# Patient Record
Sex: Female | Born: 1955 | Race: Black or African American | Hispanic: No | Marital: Single | Smoking: Former smoker
Health system: Southern US, Community
[De-identification: ages and names within clinical notes are randomized; demographics above are authoritative.]

## PROBLEM LIST (undated history)

## (undated) DIAGNOSIS — E6609 Other obesity due to excess calories: Secondary | ICD-10-CM

## (undated) DIAGNOSIS — I10 Essential (primary) hypertension: Secondary | ICD-10-CM

## (undated) DIAGNOSIS — R931 Abnormal findings on diagnostic imaging of heart and coronary circulation: Secondary | ICD-10-CM

## (undated) DIAGNOSIS — I700 Atherosclerosis of aorta: Secondary | ICD-10-CM

## (undated) HISTORY — DX: Essential (primary) hypertension: I10

## (undated) HISTORY — DX: Atherosclerosis of aorta: I70.0

## (undated) HISTORY — DX: Abnormal findings on diagnostic imaging of heart and coronary circulation: R93.1

## (undated) HISTORY — DX: Other obesity due to excess calories: E66.09

---

## 1998-06-15 ENCOUNTER — Ambulatory Visit (HOSPITAL_COMMUNITY): Admission: RE | Admit: 1998-06-15 | Discharge: 1998-06-15 | Payer: Self-pay | Admitting: Obstetrics and Gynecology

## 2002-01-12 ENCOUNTER — Other Ambulatory Visit: Admission: RE | Admit: 2002-01-12 | Discharge: 2002-01-12 | Payer: Self-pay | Admitting: Obstetrics and Gynecology

## 2003-01-31 ENCOUNTER — Other Ambulatory Visit: Admission: RE | Admit: 2003-01-31 | Discharge: 2003-01-31 | Payer: Self-pay | Admitting: Obstetrics and Gynecology

## 2004-02-20 ENCOUNTER — Other Ambulatory Visit: Admission: RE | Admit: 2004-02-20 | Discharge: 2004-02-20 | Payer: Self-pay | Admitting: Obstetrics and Gynecology

## 2005-06-25 ENCOUNTER — Other Ambulatory Visit: Admission: RE | Admit: 2005-06-25 | Discharge: 2005-06-25 | Payer: Self-pay | Admitting: Obstetrics and Gynecology

## 2005-10-02 ENCOUNTER — Encounter: Payer: Self-pay | Admitting: Obstetrics and Gynecology

## 2008-09-19 ENCOUNTER — Emergency Department (HOSPITAL_COMMUNITY): Admission: EM | Admit: 2008-09-19 | Discharge: 2008-09-19 | Payer: Self-pay | Admitting: Emergency Medicine

## 2009-04-10 ENCOUNTER — Encounter: Payer: Self-pay | Admitting: Gastroenterology

## 2009-04-19 ENCOUNTER — Encounter (INDEPENDENT_AMBULATORY_CARE_PROVIDER_SITE_OTHER): Payer: Self-pay | Admitting: *Deleted

## 2009-04-20 ENCOUNTER — Ambulatory Visit: Payer: Self-pay | Admitting: Gastroenterology

## 2009-04-21 ENCOUNTER — Encounter (INDEPENDENT_AMBULATORY_CARE_PROVIDER_SITE_OTHER): Payer: Self-pay | Admitting: *Deleted

## 2010-07-03 NOTE — Letter (Signed)
Summary: [facility]  [facility]   Imported By: Sherian Rein 04/26/2009 10:10:19  _____________________________________________________________________  External Attachment:    Type:   Image     Comment:   External Document

## 2012-05-21 ENCOUNTER — Other Ambulatory Visit: Payer: Self-pay | Admitting: Internal Medicine

## 2012-05-21 DIAGNOSIS — E049 Nontoxic goiter, unspecified: Secondary | ICD-10-CM

## 2012-05-22 ENCOUNTER — Other Ambulatory Visit: Payer: Self-pay

## 2012-05-22 ENCOUNTER — Ambulatory Visit
Admission: RE | Admit: 2012-05-22 | Discharge: 2012-05-22 | Disposition: A | Discharge status: Home / Self Care | Payer: BC Managed Care – PPO | Source: Ambulatory Visit | Attending: Internal Medicine | Admitting: Internal Medicine

## 2012-05-22 DIAGNOSIS — E049 Nontoxic goiter, unspecified: Secondary | ICD-10-CM

## 2012-07-02 ENCOUNTER — Other Ambulatory Visit: Payer: Self-pay | Admitting: Endocrinology

## 2012-07-02 DIAGNOSIS — E041 Nontoxic single thyroid nodule: Secondary | ICD-10-CM

## 2012-12-07 ENCOUNTER — Ambulatory Visit
Admission: RE | Admit: 2012-12-07 | Discharge: 2012-12-07 | Disposition: A | Discharge status: Home / Self Care | Payer: BC Managed Care – PPO | Source: Ambulatory Visit | Attending: Endocrinology | Admitting: Endocrinology

## 2012-12-07 ENCOUNTER — Other Ambulatory Visit: Payer: BC Managed Care – PPO

## 2012-12-07 DIAGNOSIS — E041 Nontoxic single thyroid nodule: Secondary | ICD-10-CM

## 2013-03-01 ENCOUNTER — Other Ambulatory Visit: Payer: Self-pay | Admitting: Endocrinology

## 2013-03-01 DIAGNOSIS — E041 Nontoxic single thyroid nodule: Secondary | ICD-10-CM

## 2013-12-31 ENCOUNTER — Ambulatory Visit
Admission: RE | Admit: 2013-12-31 | Discharge: 2013-12-31 | Disposition: A | Discharge status: Home / Self Care | Payer: BC Managed Care – PPO | Source: Ambulatory Visit | Attending: Endocrinology | Admitting: Endocrinology

## 2013-12-31 DIAGNOSIS — E041 Nontoxic single thyroid nodule: Secondary | ICD-10-CM

## 2016-11-29 ENCOUNTER — Other Ambulatory Visit: Payer: Self-pay | Admitting: Obstetrics and Gynecology

## 2016-11-29 DIAGNOSIS — R928 Other abnormal and inconclusive findings on diagnostic imaging of breast: Secondary | ICD-10-CM

## 2016-12-03 ENCOUNTER — Ambulatory Visit: Admission: RE | Admit: 2016-12-03 | Payer: Self-pay | Source: Ambulatory Visit

## 2016-12-03 ENCOUNTER — Ambulatory Visit
Admission: RE | Admit: 2016-12-03 | Discharge: 2016-12-03 | Disposition: A | Discharge status: Home / Self Care | Payer: BLUE CROSS/BLUE SHIELD | Source: Ambulatory Visit | Attending: Obstetrics and Gynecology | Admitting: Obstetrics and Gynecology

## 2016-12-03 DIAGNOSIS — R928 Other abnormal and inconclusive findings on diagnostic imaging of breast: Secondary | ICD-10-CM

## 2019-04-21 ENCOUNTER — Other Ambulatory Visit: Payer: Self-pay | Admitting: Obstetrics and Gynecology

## 2019-04-21 DIAGNOSIS — Z803 Family history of malignant neoplasm of breast: Secondary | ICD-10-CM

## 2019-06-03 ENCOUNTER — Other Ambulatory Visit: Payer: BLUE CROSS/BLUE SHIELD

## 2021-02-28 ENCOUNTER — Other Ambulatory Visit: Payer: Self-pay | Admitting: Internal Medicine

## 2021-02-28 DIAGNOSIS — I10 Essential (primary) hypertension: Secondary | ICD-10-CM

## 2021-03-22 ENCOUNTER — Ambulatory Visit
Admission: RE | Admit: 2021-03-22 | Discharge: 2021-03-22 | Disposition: A | Discharge status: Home / Self Care | Payer: Self-pay | Source: Ambulatory Visit | Attending: Internal Medicine | Admitting: Internal Medicine

## 2021-03-22 DIAGNOSIS — I10 Essential (primary) hypertension: Secondary | ICD-10-CM

## 2021-05-24 ENCOUNTER — Other Ambulatory Visit: Payer: Self-pay

## 2021-05-24 ENCOUNTER — Ambulatory Visit: Payer: Managed Care, Other (non HMO) | Admitting: Cardiology

## 2021-05-24 ENCOUNTER — Encounter: Payer: Self-pay | Admitting: Cardiology

## 2021-05-24 VITALS — BP 132/78 | HR 65 | Temp 98.1°F | Resp 16 | Ht 68.5 in | Wt 227.2 lb

## 2021-05-24 DIAGNOSIS — R931 Abnormal findings on diagnostic imaging of heart and coronary circulation: Secondary | ICD-10-CM | POA: Insufficient documentation

## 2021-05-24 DIAGNOSIS — I700 Atherosclerosis of aorta: Secondary | ICD-10-CM

## 2021-05-24 DIAGNOSIS — I10 Essential (primary) hypertension: Secondary | ICD-10-CM

## 2021-05-24 DIAGNOSIS — E6609 Other obesity due to excess calories: Secondary | ICD-10-CM | POA: Insufficient documentation

## 2021-05-24 MED ORDER — ASPIRIN EC 81 MG PO TBEC: 81.0000 mg | DELAYED_RELEASE_TABLET | Freq: Every day | ORAL | 11 refills | Status: AC

## 2021-05-24 MED ORDER — ATORVASTATIN CALCIUM 20 MG PO TABS: 20.0000 mg | ORAL_TABLET | Freq: Every day | ORAL | 0 refills | Status: DC

## 2021-05-24 NOTE — Progress Notes (Signed)
Date:  05/24/2021   ID:  FREDERICK KLINGER, DOB 04/25/1956, MRN [id number]  PCP:  Deland Pretty, MD  Cardiologist:  Rex Kras, DO, [facility] (established care 05/24/2021)  REASON FOR CONSULT: Coronary artery disease  REQUESTING PHYSICIAN:  Deland Pretty, MD [address] [place],  [place] [address]  Chief Complaint  Patient presents with   Coronary Artery Disease   New Patient (Initial Visit)    HPI  Alice Castaneda is a 65 y.o. African-American female who presents to the office with a chief complaint of " CAD evaluation due to elevated coronary calcium score." Patient's past medical history and cardiovascular risk factors include: Mild coronary artery calcification (total CAC 62.7, 86 percentile), aortic atherosclerosis, hx of fen-phen utilization, former smoker, pulmonary nodule, family history of heart disease.  She is referred to the office at the request of Deland Pretty, MD for evaluation of CAD evaluation (coronary artery calcification).  Patient recently had a comprehensive screening performed by PCP according to the patient and one of the items that was looked into his coronary artery calcification.    Patient had a total coronary calcium score of 62.7 AU placing her at the 86 percentile.  She is now referred to cardiology for further evaluation and management.  She denies any anginal chest pain however does have shortness of breath with exertion.  Patient states that she can walk long distances but unable to pick up pace without becoming symptomatic.  This has been chronic and stable.  She denies any heart failure symptoms.  No structured exercise program or daily routine.  No family history of premature CAD, sudden cardiac death, or cardiomyopathy.  FUNCTIONAL STATUS: No structured exercise program or daily routine.   ALLERGIES: No Known Allergies  MEDICATION LIST PRIOR TO VISIT: Current Meds  Medication Sig   aspirin EC 81 MG tablet Take 1 tablet (81 mg  total) by mouth daily. Swallow whole.   atorvastatin (LIPITOR) 20 MG tablet Take 1 tablet (20 mg total) by mouth at bedtime.   losartan-hydrochlorothiazide (HYZAAR) 100-25 MG tablet Take 1 tablet by mouth daily.     PAST MEDICAL HISTORY: Past Medical History:  Diagnosis Date   Agatston coronary artery calcium score less than 100    Aortic atherosclerosis (HCC)    Hypertension    Obesity due to excess calories     PAST SURGICAL HISTORY: History reviewed. No pertinent surgical history.  FAMILY HISTORY: The patient family history includes Breast cancer in her maternal grandmother and mother; Diabetes in her father and sister; Heart failure in her mother; Stroke in her father.  SOCIAL HISTORY:  The patient  reports that she has quit smoking. Her smoking use included cigarettes. She has a 5.00 pack-year smoking history. She has never used smokeless tobacco. She reports current alcohol use of about 3.0 standard drinks per week. She reports that she does not use drugs.  REVIEW OF SYSTEMS: Review of Systems  Constitutional: Negative for chills and fever.  HENT:  Negative for hoarse voice and nosebleeds.   Eyes:  Negative for discharge, double vision and pain.  Cardiovascular:  Positive for dyspnea on exertion (chronic and stable). Negative for chest pain, claudication, leg swelling, near-syncope, orthopnea, palpitations, paroxysmal nocturnal dyspnea and syncope.  Respiratory:  Negative for hemoptysis and shortness of breath.   Musculoskeletal:  Negative for muscle cramps and myalgias.  Gastrointestinal:  Negative for abdominal pain, constipation, diarrhea, hematemesis, hematochezia, melena, nausea and vomiting.  Neurological:  Negative for dizziness and light-headedness.  PHYSICAL EXAM: Vitals with BMI 05/24/2021  Height 5' 8.5"  Weight 227 lbs 3 oz  BMI 98.26  Systolic 415  Diastolic 78  Pulse 65    CONSTITUTIONAL: Well-developed and well-nourished. No acute distress.  SKIN:  Skin is warm and dry. No rash noted. No cyanosis. No pallor. No jaundice HEAD: Normocephalic and atraumatic.  EYES: No scleral icterus MOUTH/THROAT: Moist oral membranes.  NECK: No JVD present. No thyromegaly noted. No carotid bruits  LYMPHATIC: No visible cervical adenopathy.  CHEST Normal respiratory effort. No intercostal retractions  LUNGS: Clear to auscultation bilaterally.  No stridor. No wheezes. No rales.  CARDIOVASCULAR: Regular rate and rhythm, positive S1-S2, no murmurs rubs or gallops appreciated. ABDOMINAL: No apparent ascites.  EXTREMITIES: No peripheral edema, warm to touch, 2+ bilateral DP and PT pulses HEMATOLOGIC: No significant bruising NEUROLOGIC: Oriented to person, place, and time. Nonfocal. Normal muscle tone.  PSYCHIATRIC: Normal mood and affect. Normal behavior. Cooperative  CARDIAC DATABASE: EKG: 05/24/2021: Sinus bradycardia, 57 bpm, without underlying ischemia or injury pattern.  Echocardiogram: No results found for this or any previous visit from the past 1095 days.    Stress Testing: No results found for this or any previous visit from the past 1095 days.   Heart Catheterization: None  Coronary artery calcium scoring: 03/22/2021: Left Main: 0   LAD: 62.7   LCx: 0   RCA: 0   Total Agatston Score: 62.7   MESA database percentile: 86   AORTA MEASUREMENTS: Ascending Aorta: 29 mm Descending Aorta: 24 mm 1. Coronary calcium score is 62.7 and this is at percentile 86 for patients of the same age, gender and ethnicity. 2. Small indeterminate pulmonary nodules. No follow-up needed if patient is low-risk (and has no known or suspected primary neoplasm). Non-contrast chest CT can be considered in 12 months if patient is high-risk. This recommendation follows the consensus statement: Guidelines for Management of Incidental Pulmonary Nodules Detected on CT Images: From the Fleischner Society 2017; Radiology 2017; 284:228-243. 3.  Aortic  Atherosclerosis (ICD10-I70.0).  LABORATORY DATA: External Labs: Collected:  Total cholesterol 151, HDL 58, LDL 76, triglycerides 85 Hemoglobin 13.7 g/dL, hematocrit 43.4% Sodium 144, potassium 4.3, chloride 108, bicarb 28, BUN 19, creatinine 1.17 mg/dL. eGFR 57 TSH 1.74   IMPRESSION:    ICD-10-CM   1. Agatston coronary artery calcium score less than 100  R93.1 EKG 12-Lead    PCV ECHOCARDIOGRAM COMPLETE    PCV MYOCARDIAL PERFUSION WO LEXISCAN    aspirin EC 81 MG tablet    atorvastatin (LIPITOR) 20 MG tablet    Lipid Panel With LDL/HDL Ratio    LDL cholesterol, direct    CMP14+EGFR    2. Aortic atherosclerosis (HCC)  I70.0 PCV MYOCARDIAL PERFUSION WO LEXISCAN    aspirin EC 81 MG tablet    atorvastatin (LIPITOR) 20 MG tablet    3. Benign hypertension  I10 PCV ECHOCARDIOGRAM COMPLETE    4. Class 1 obesity due to excess calories with serious comorbidity and body mass index (BMI) of 34.0 to 34.9 in adult  E66.09    Z68.34        RECOMMENDATIONS: Alice Castaneda is a 65 y.o. African-American female whose past medical history and cardiac risk factors include: Mild coronary artery calcification (total CAC 62.7, 86 percentile), aortic atherosclerosis, hx of fen-phen utilization, former smoker, pulmonary nodule, family history of heart disease.  Agatston coronary artery calcium score less than 100 Total CAC 62.7AU, 86 percentile Start aspirin 81 mg p.o. daily. Start atorvastatin 20  mg p.o. nightly. Echocardiogram will be ordered to evaluate for structural heart disease and left ventricular systolic function. Nuclear stress test recommended to evaluate for reversible ischemia. Most recent lipid profile reviewed and noted above for further reference. Recheck fasting lipid profile and LFTs in 6 weeks after starting statin therapy.  Aortic atherosclerosis ([place]) See above  Benign hypertension Office blood pressures within acceptable range.  Medications reviewed.  Low salt diet  recommended.   Class 1 obesity due to excess calories with serious comorbidity and body mass index (BMI) of 34.0 to 34.9 in adult Body mass index is 34.04 kg/m. I reviewed with the patient the importance of diet, regular physical activity/exercise, weight loss.   Patient is educated on increasing physical activity gradually as tolerated.  With the goal of moderate intensity exercise for 30 minutes a day 5 days a week.  FINAL MEDICATION LIST END OF ENCOUNTER: Meds ordered this encounter  Medications   aspirin EC 81 MG tablet    Sig: Take 1 tablet (81 mg total) by mouth daily. Swallow whole.    Dispense:  30 tablet    Refill:  11   atorvastatin (LIPITOR) 20 MG tablet    Sig: Take 1 tablet (20 mg total) by mouth at bedtime.    Dispense:  90 tablet    Refill:  0    There are no discontinued medications.   Current Outpatient Medications:    aspirin EC 81 MG tablet, Take 1 tablet (81 mg total) by mouth daily. Swallow whole., Disp: 30 tablet, Rfl: 11   atorvastatin (LIPITOR) 20 MG tablet, Take 1 tablet (20 mg total) by mouth at bedtime., Disp: 90 tablet, Rfl: 0   losartan-hydrochlorothiazide (HYZAAR) 100-25 MG tablet, Take 1 tablet by mouth daily., Disp: , Rfl:   Orders Placed This Encounter  Procedures   Lipid Panel With LDL/HDL Ratio   LDL cholesterol, direct   CMP14+EGFR   PCV MYOCARDIAL PERFUSION WO LEXISCAN   EKG 12-Lead   PCV ECHOCARDIOGRAM COMPLETE    There are no Patient Instructions on file for this visit.   --Continue cardiac medications as reconciled in final medication list. --Return in about 7 weeks (around 07/12/2021). Or sooner if needed. --Continue follow-up with your primary care physician regarding the management of your other chronic comorbid conditions.  Patient's questions and concerns were addressed to her satisfaction. She voices understanding of the instructions provided during this encounter.   This note was created using a voice recognition software as a  result there may be grammatical errors inadvertently enclosed that do not reflect the nature of this encounter. Every attempt is made to correct such errors.  Rex Kras, [place], [facility]  Pager: [phone] Office: [phone]

## 2021-06-04 ENCOUNTER — Encounter: Payer: Self-pay | Admitting: Cardiology

## 2021-06-07 ENCOUNTER — Other Ambulatory Visit: Payer: Self-pay

## 2021-06-07 DIAGNOSIS — R931 Abnormal findings on diagnostic imaging of heart and coronary circulation: Secondary | ICD-10-CM

## 2021-06-18 ENCOUNTER — Other Ambulatory Visit: Payer: Self-pay

## 2021-06-18 ENCOUNTER — Encounter: Payer: Self-pay | Admitting: Plastic Surgery

## 2021-06-18 ENCOUNTER — Ambulatory Visit: Payer: Managed Care, Other (non HMO)

## 2021-06-18 ENCOUNTER — Ambulatory Visit (INDEPENDENT_AMBULATORY_CARE_PROVIDER_SITE_OTHER): Payer: Self-pay | Admitting: Plastic Surgery

## 2021-06-18 DIAGNOSIS — Z719 Counseling, unspecified: Secondary | ICD-10-CM

## 2021-06-18 DIAGNOSIS — I700 Atherosclerosis of aorta: Secondary | ICD-10-CM

## 2021-06-18 DIAGNOSIS — R931 Abnormal findings on diagnostic imaging of heart and coronary circulation: Secondary | ICD-10-CM

## 2021-06-18 NOTE — Progress Notes (Addendum)
Patient ID: Alice Castaneda, female    DOB: [birth year], 66 y.o.   MRN: [id number]   Chief Complaint  Patient presents with   Advice Only    The patient is a 66 year old female here for evaluation of her face.  Feels that she has a very tired look.  She does not like the excess fat on the lower implants and the excess skin on the upper lids.  She is 5 feet 8 inches tall and weighs 220 pounds.  She has excess skin of the upper lids with a little bit of medial fat excess on each lead.  The lower lid looks like it is mostly excess fat or better yet bulging fat with loss in the tear trough area.  She does not have any eye problems and she is otherwise in good health.  She does not have a history of any surgeries but does not think she keloids as she had a burn to her foot a couple years ago and healed very nicely.  No symptoms of dry eyes or signs of scleral irritation.   Review of Systems  Constitutional: Negative.   HENT: Negative.    Eyes: Negative.   Respiratory: Negative.    Cardiovascular: Negative.   Gastrointestinal: Negative.   Endocrine: Negative.   Genitourinary: Negative.   Musculoskeletal: Negative.   Skin: Negative.   Psychiatric/Behavioral: Negative.     Past Medical History:  Diagnosis Date   Agatston coronary artery calcium score less than 100    Aortic atherosclerosis (HCC)    Hypertension    Obesity due to excess calories     History reviewed. No pertinent surgical history.    Current Outpatient Medications:    aspirin EC 81 MG tablet, Take 1 tablet (81 mg total) by mouth daily. Swallow whole., Disp: 30 tablet, Rfl: 11   atorvastatin (LIPITOR) 20 MG tablet, Take 1 tablet (20 mg total) by mouth at bedtime., Disp: 90 tablet, Rfl: 0   losartan-hydrochlorothiazide (HYZAAR) 100-25 MG tablet, Take 1 tablet by mouth daily., Disp: , Rfl:    Objective:   There were no vitals filed for this visit.  Physical Exam Constitutional:      Appearance: Normal appearance.   HENT:     Head: Normocephalic and atraumatic.  Cardiovascular:     Rate and Rhythm: Normal rate.     Pulses: Normal pulses.  Pulmonary:     Effort: Pulmonary effort is normal. No respiratory distress.  Abdominal:     General: There is no distension.     Palpations: Abdomen is soft.     Tenderness: There is no abdominal tenderness.  Musculoskeletal:        General: No swelling.  Skin:    General: Skin is warm.     Capillary Refill: Capillary refill takes less than 2 seconds.  Neurological:     Mental Status: She is alert and oriented to person, place, and time.  Psychiatric:        Mood and Affect: Mood normal.        Behavior: Behavior normal.        Thought Content: Thought content normal.    Assessment & Plan:  Encounter for counseling  The patient is a good candidate for bilateral upper and lower lid blepharoplasty with a conjunctival approach for the lower lid.  We will send her a quote.  We discussed time off of work and scars.  Pictures were obtained of the patient and placed in the  chart with the patient's or guardian's permission.   [place], DO

## 2021-06-19 ENCOUNTER — Ambulatory Visit: Payer: Managed Care, Other (non HMO)

## 2021-06-19 DIAGNOSIS — I10 Essential (primary) hypertension: Secondary | ICD-10-CM

## 2021-06-19 DIAGNOSIS — R931 Abnormal findings on diagnostic imaging of heart and coronary circulation: Secondary | ICD-10-CM

## 2021-06-26 ENCOUNTER — Encounter: Payer: Self-pay | Admitting: Plastic Surgery

## 2021-06-30 LAB — LIPID PANEL WITH LDL/HDL RATIO
Cholesterol, Total: 134 mg/dL (ref 100–199)
HDL: 62 mg/dL (ref >39)
LDL Chol Calc (NIH): 59 mg/dL (ref 0–99)
LDL/HDL Ratio: 1 ratio (ref 0.0–3.2)
Triglycerides: 63 mg/dL (ref 0–149)
VLDL Cholesterol Cal: 13 mg/dL (ref 5–40)

## 2021-06-30 LAB — CMP14+EGFR
ALT: 20 IU/L (ref 0–32)
AST: 16 IU/L (ref 0–40)
Albumin/Globulin Ratio: 1.3 (ref 1.2–2.2)
Albumin: 4.2 g/dL (ref 3.8–4.8)
Alkaline Phosphatase: 65 IU/L (ref 44–121)
BUN/Creatinine Ratio: 15 (ref 12–28)
BUN: 14 mg/dL (ref 8–27)
Bilirubin Total: 0.9 mg/dL (ref 0.0–1.2)
CO2: 26 mmol/L (ref 20–29)
Calcium: 9.4 mg/dL (ref 8.7–10.3)
Chloride: 104 mmol/L (ref 96–106)
Creatinine, Ser: 0.92 mg/dL (ref 0.57–1.00)
Globulin, Total: 3.3 g/dL (ref 1.5–4.5)
Glucose: 90 mg/dL (ref 70–99)
Potassium: 3.9 mmol/L (ref 3.5–5.2)
Sodium: 142 mmol/L (ref 134–144)
Total Protein: 7.5 g/dL (ref 6.0–8.5)
eGFR: 70 mL/min/{1.73_m2} (ref >59)

## 2021-07-04 LAB — LDL CHOLESTEROL, DIRECT: LDL Direct: 59 mg/dL (ref 0–99)

## 2021-07-06 ENCOUNTER — Encounter: Payer: Self-pay | Admitting: Plastic Surgery

## 2021-07-09 ENCOUNTER — Telehealth: Payer: Self-pay

## 2021-07-09 NOTE — Telephone Encounter (Signed)
I called patient to schedule a televisit with Dr. Ulice Bold and she said she no longer thinks she needs this appointment.  Patient said she wasn't aware of the pre-op appointment before her surgery.  Patient said she thought she wasn't going to be able to talk with her provider again before the surgery and she had questions.  She said that she is fine to wait until her scheduled pre-op appointment to go over her questions with Keenan Bachelor, PA-C.

## 2021-07-10 ENCOUNTER — Telehealth: Payer: Self-pay

## 2021-07-10 NOTE — Telephone Encounter (Signed)
Faxed surgical clearance to Dr Nash Dimmer, DO [facility]

## 2021-07-11 ENCOUNTER — Ambulatory Visit: Payer: Managed Care, Other (non HMO) | Admitting: Cardiology

## 2021-07-13 ENCOUNTER — Encounter: Payer: Self-pay | Admitting: Surgical

## 2021-07-13 NOTE — Progress Notes (Signed)
Surgical Clearance has been received from Dr. Tessa Lerner, DO for patient's upcoming surgery bilateral upper and lower lid blepharoplasty with Dr. Ulice Bold.  Patient can hold aspirin 81 mg prior to surgery, recommend holding 7 days prior to surgery.  Restart when appropriate hemostasis is achieved.  Recommend patient take blood pressure medications day of surgery.  Patient is acceptable/low risk for upcoming surgery.

## 2021-07-23 ENCOUNTER — Other Ambulatory Visit: Payer: Self-pay

## 2021-07-23 ENCOUNTER — Encounter: Payer: Self-pay | Admitting: Cardiology

## 2021-07-23 ENCOUNTER — Ambulatory Visit: Payer: Managed Care, Other (non HMO) | Admitting: Cardiology

## 2021-07-23 VITALS — BP 126/76 | HR 63 | Resp 16 | Ht 68.0 in | Wt 230.0 lb

## 2021-07-23 DIAGNOSIS — I10 Essential (primary) hypertension: Secondary | ICD-10-CM

## 2021-07-23 DIAGNOSIS — R931 Abnormal findings on diagnostic imaging of heart and coronary circulation: Secondary | ICD-10-CM

## 2021-07-23 DIAGNOSIS — E66811 Obesity, class 1: Secondary | ICD-10-CM

## 2021-07-23 DIAGNOSIS — E6609 Other obesity due to excess calories: Secondary | ICD-10-CM

## 2021-07-23 DIAGNOSIS — Z6834 Body mass index (BMI) 34.0-34.9, adult: Secondary | ICD-10-CM

## 2021-07-23 DIAGNOSIS — I700 Atherosclerosis of aorta: Secondary | ICD-10-CM

## 2021-07-23 NOTE — Progress Notes (Signed)
Date:  05/24/2021   ID:  Alice Castaneda, DOB [birth year], MRN [id number]  PCP:  Alice Pretty, MD  Cardiologist:  Rex Kras, DO, [facility] (established care 05/24/2021)  Date: 07/23/21 Last Office Visit: 05/24/2021  Chief Complaint  Patient presents with   Results   Follow-up    HPI  Alice Castaneda is a 66 y.o. African-American female who presents to the office with a chief complaint of " follow-up for coronary artery calcification and discuss test results." Patient's past medical history and cardiovascular risk factors include: Mild coronary artery calcification (total CAC 62.7, 86 percentile), aortic atherosclerosis, hx of fen-phen utilization, former smoker, pulmonary nodule, family history of heart disease.  She is referred to the office at the request of Alice Pretty, MD for evaluation of CAD evaluation (coronary artery calcification).  Prior to establishing care patient underwent a comprehensive screening with her PCP and was noted to have mild coronary artery calcification.  Total CAC was 62.7 AU placing her at the 86 percentile.  She was referred to cardiology for further evaluation and management.  She did not have any anginal discomfort but would have shortness of breath with over exertional activity.  Patient states that she walks regularly when she tries to pick up the pace she becomes symptomatic.  Since last office visit she underwent an echocardiogram which notes preserved LVEF and stress test is overall low risk.  Results of both diagnostic testing were reviewed with her in great detail and noted below for further reference.  She was started on aspirin and cholesterol medication given her mild CAC and increased percentile compared to her cohorts.  She is tolerating aspirin 81 mg p.o. daily without any intolerances.  And repeat lipid profile independently reviewed notes significant improvement in LDL levels, and stable AST/ALT.  No new anginal discomfort or heart failure  symptoms.  FUNCTIONAL STATUS: No structured exercise program or daily routine.   ALLERGIES: No Known Allergies  MEDICATION LIST PRIOR TO VISIT: Current Meds  Medication Sig   aspirin EC 81 MG tablet Take 1 tablet (81 mg total) by mouth daily. Swallow whole.   atorvastatin (LIPITOR) 20 MG tablet Take 1 tablet (20 mg total) by mouth at bedtime.   losartan-hydrochlorothiazide (HYZAAR) 100-25 MG tablet Take 1 tablet by mouth daily.     PAST MEDICAL HISTORY: Past Medical History:  Diagnosis Date   Agatston coronary artery calcium score less than 100    Aortic atherosclerosis (HCC)    Hypertension    Obesity due to excess calories     PAST SURGICAL HISTORY: History reviewed. No pertinent surgical history.  FAMILY HISTORY: The patient family history includes Breast cancer in her maternal grandmother and mother; Diabetes in her father and sister; Heart failure in her mother; Stroke in her father.  SOCIAL HISTORY:  The patient  reports that she has quit smoking. Her smoking use included cigarettes. She has a 5.00 pack-year smoking history. She has never used smokeless tobacco. She reports current alcohol use of about 3.0 standard drinks per week. She reports that she does not use drugs.  REVIEW OF SYSTEMS: Review of Systems  Constitutional: Negative for chills and fever.  HENT:  Negative for hoarse voice and nosebleeds.   Eyes:  Negative for discharge, double vision and pain.  Cardiovascular:  Positive for dyspnea on exertion (chronic and stable). Negative for chest pain, claudication, leg swelling, near-syncope, orthopnea, palpitations, paroxysmal nocturnal dyspnea and syncope.  Respiratory:  Negative for hemoptysis and shortness of breath.  Musculoskeletal:  Negative for muscle cramps and myalgias.  Gastrointestinal:  Negative for abdominal pain, constipation, diarrhea, hematemesis, hematochezia, melena, nausea and vomiting.  Neurological:  Negative for dizziness and  light-headedness.   PHYSICAL EXAM: Vitals with BMI 07/23/2021 06/18/2021 05/24/2021  Height _0  _1  5' 8.5"  Weight 230 lbs 220 lbs 227 lbs 3 oz  BMI 34.98 82.95 62.13  Systolic 086 578 469  Diastolic 76 76 78  Pulse 63 - 65    CONSTITUTIONAL: Well-developed and well-nourished. No acute distress.  SKIN: Skin is warm and dry. No rash noted. No cyanosis. No pallor. No jaundice HEAD: Normocephalic and atraumatic.  EYES: No scleral icterus MOUTH/THROAT: Moist oral membranes.  NECK: No JVD present. No thyromegaly noted. No carotid bruits  LYMPHATIC: No visible cervical adenopathy.  CHEST Normal respiratory effort. No intercostal retractions  LUNGS: Clear to auscultation bilaterally.  No stridor. No wheezes. No rales.  CARDIOVASCULAR: Regular rate and rhythm, positive S1-S2, no murmurs rubs or gallops appreciated. ABDOMINAL: No apparent ascites.  EXTREMITIES: No peripheral edema, warm to touch, 2+ bilateral DP and PT pulses HEMATOLOGIC: No significant bruising NEUROLOGIC: Oriented to person, place, and time. Nonfocal. Normal muscle tone.  PSYCHIATRIC: Normal mood and affect. Normal behavior. Cooperative  CARDIAC DATABASE: EKG: 05/24/2021: Sinus bradycardia, 57 bpm, without underlying ischemia or injury pattern.  Echocardiogram: 06/19/2021: Left ventricle cavity is normal in size and wall thickness. Normal global wall motion. Normal LV systolic function with EF 61%. Normal diastolic filling pattern.  Trace MR, trace TR. No evidence of pulmonary hypertension.  Stress Testing: Exercise Sestamibi stress test 06/18/2021: Exercise time 5 minutes 55 seconds on Bruce protocol, achieved 7.05 METS, 91% of APMHR. Stress ECG negative for ischemia. Normal myocardial perfusion without convincing evidence of reversible myocardial ischemia or prior infarct. Calculated LVEF preserved, 78%. Left ventricular wall thickness preserved without regional wall motion abnormalities. No prior studies  available for comparison. Low risk study.  Heart Catheterization: None  Coronary artery calcium scoring: 03/22/2021: Left Main: 0   LAD: 62.7   LCx: 0   RCA: 0   Total Agatston Score: 62.7   MESA database percentile: 86   AORTA MEASUREMENTS: Ascending Aorta: 29 mm Descending Aorta: 24 mm 1. Coronary calcium score is 62.7 and this is at percentile 86 for patients of the same age, gender and ethnicity. 2. Small indeterminate pulmonary nodules. No follow-up needed if patient is low-risk (and has no known or suspected primary neoplasm). Non-contrast chest CT can be considered in 12 months if patient is high-risk. This recommendation follows the consensus statement: Guidelines for Management of Incidental Pulmonary Nodules Detected on CT Images: From the Fleischner Society 2017; Radiology 2017; 284:228-243. 3.  Aortic Atherosclerosis (ICD10-I70.0).  LABORATORY DATA: External Labs: Collected:  Total cholesterol 151, HDL 58, LDL 76, triglycerides 85 Hemoglobin 13.7 g/dL, hematocrit 43.4% Sodium 144, potassium 4.3, chloride 108, bicarb 28, BUN 19, creatinine 1.17 mg/dL. eGFR 57 TSH 1.74   IMPRESSION:    ICD-10-CM   1. Agatston coronary artery calcium score less than 100  R93.1     2. Aortic atherosclerosis (HCC)  I70.0     3. Benign hypertension  I10     4. Class 1 obesity due to excess calories with serious comorbidity and body mass index (BMI) of 34.0 to 34.9 in adult  E66.09    Z68.34        RECOMMENDATIONS: ZACARI RADICK is a 66 y.o. African-American female whose past medical history and cardiac risk factors include: Mild  coronary artery calcification (total CAC 62.7, 86 percentile), aortic atherosclerosis, hx of fen-phen utilization, former smoker, pulmonary nodule, family history of heart disease.  Agatston coronary artery calcium score less than 100 /aortic atherosclerosis: Chronic and stable. Continue aspirin and statin therapy. Independently reviewed labs  06/29/2021 -noted above for further reference. Also reviewed echocardiogram and stress test results.  LVEF is preserved, and stress test is overall low risk study. No anginal discomfort or heart failure symptoms.  Benign hypertension Office blood pressures are very well controlled. Medications reconciled. No additional changes recommended  Class 1 obesity due to excess calories with serious comorbidity and body mass index (BMI) of 34.0 to 34.9 in adult Patient is motivated with regards to weight loss. Body mass index is 34.97 kg/m. I reviewed with the patient the importance of diet, regular physical activity/exercise, weight loss.   Patient is educated on increasing physical activity gradually as tolerated.  With the goal of moderate intensity exercise for 30 minutes a day 5 days a week.  During today's office encounter we discussed management of at least 2 chronic comorbid conditions, waiting on the response to recently initiated pharmacological therapy aspirin/statin therapy, independently reviewed labs, echocardiogram, stress test results.  Discussed disease management and longitudinal follow-up.  FINAL MEDICATION LIST END OF ENCOUNTER: No orders of the defined types were placed in this encounter.   There are no discontinued medications.   Current Outpatient Medications:    aspirin EC 81 MG tablet, Take 1 tablet (81 mg total) by mouth daily. Swallow whole., Disp: 30 tablet, Rfl: 11   atorvastatin (LIPITOR) 20 MG tablet, Take 1 tablet (20 mg total) by mouth at bedtime., Disp: 90 tablet, Rfl: 0   losartan-hydrochlorothiazide (HYZAAR) 100-25 MG tablet, Take 1 tablet by mouth daily., Disp: , Rfl:   No orders of the defined types were placed in this encounter.   There are no Patient Instructions on file for this visit.   --Continue cardiac medications as reconciled in final medication list. --Return in about 1 year (around 07/04/2022) for Follow up, Coronary artery calcification. Or  sooner if needed. --Continue follow-up with your primary care physician regarding the management of your other chronic comorbid conditions.  Patient's questions and concerns were addressed to her satisfaction. She voices understanding of the instructions provided during this encounter.   This note was created using a voice recognition software as a result there may be grammatical errors inadvertently enclosed that do not reflect the nature of this encounter. Every attempt is made to correct such errors.  Rex Kras, [place], [facility]  Pager: [phone] Office: [phone]

## 2021-08-06 NOTE — Progress Notes (Unsigned)
Patient ID: Alice Castaneda, female    DOB: 1955-08-21, 66 y.o.   MRN: [id number]  Chief Complaint  Patient presents with   Pre-op Exam      ICD-10-CM   1. Encounter for counseling  Z71.9       History of Present Illness: Alice Castaneda is a 66 y.o.  female .  She presents for preoperative evaluation for upcoming procedure, bilateral upper and lower lid blepharoplasty, fat filling to bilateral earlobe, scheduled for 08/30/2021 with Dr. Ulice Bold.  The patient has not had problems with anesthesia. No history of DVT/PE.  No family history of DVT/PE.  No family or personal history of bleeding or clotting disorders.  Patient is not currently taking any blood thinners.  No history of CVA/MI.   PMH Significant for: Aortic atherosclerosis, hypertension, mild coronary artery calcification She is on ASA 81 mg daily, per cardiology patient can hold 7 days prior to surgery. Patient is aware of this.  She did have an echocardiogram and stress test in January 2023.  Stress EKG negative for ischemia, low risk study.  Essentially normal echocardiogram.  Patient has a lot of good questions about the scheduled procedure, she also has some questions about botox for her forehead. She reports she had a second opinion at another office as well for her eyelid surgery. She has questions about post-surgery insurance coverage.   She reports today that she read through the consent form sent to her via MyChart and she has a list of questions about the surgery and the consent forms.   Past Medical History: Allergies: No Known Allergies  Current Medications:  Current Outpatient Medications:    aspirin EC 81 MG tablet, Take 1 tablet (81 mg total) by mouth daily. Swallow whole., Disp: 30 tablet, Rfl: 11   atorvastatin (LIPITOR) 20 MG tablet, Take 1 tablet (20 mg total) by mouth at bedtime., Disp: 90 tablet, Rfl: 0   losartan-hydrochlorothiazide (HYZAAR) 100-25 MG tablet, Take 1 tablet by mouth daily., Disp:  , Rfl:   Past Medical Problems: Past Medical History:  Diagnosis Date   Agatston coronary artery calcium score less than 100    Aortic atherosclerosis (HCC)    Hypertension    Obesity due to excess calories     Past Surgical History: No past surgical history on file.  Social History: Social History   Socioeconomic History   Marital status: Single    Spouse name: Not on file   Number of children: 0   Years of education: Not on file   Highest education level: Not on file  Occupational History   Not on file  Tobacco Use   Smoking status: Former    Packs/day: 0.25    Years: 20.00    Pack years: 5.00    Types: Cigarettes   Smokeless tobacco: Never  Vaping Use   Vaping Use: Never used  Substance and Sexual Activity   Alcohol use: Yes    Alcohol/week: 3.0 standard drinks    Types: 1 Glasses of wine, 1 Cans of beer, 1 Shots of liquor per week    Comment: 2 drinks a month   Drug use: Never   Sexual activity: Not on file  Other Topics Concern   Not on file  Social History Narrative   Not on file   Social Determinants of Health   Financial Resource Strain: Not on file  Food Insecurity: Not on file  Transportation Needs: Not on file  Physical Activity: Not on  file  Stress: Not on file  Social Connections: Not on file  Intimate Partner Violence: Not on file    Family History: Family History  Problem Relation Age of Onset   Heart failure Mother    Breast cancer Mother    Stroke Father    Diabetes Father    Diabetes Sister    Breast cancer Maternal Grandmother     Review of Systems: Review of Systems  Constitutional: Negative.   Respiratory: Negative.    Cardiovascular: Negative.   Gastrointestinal: Negative.   Neurological: Negative.    Physical Exam: Vital Signs BP (!) 146/83 (BP Location: Left Arm, Patient Position: Sitting, Cuff Size: Large)    Pulse 71    Ht 5' 8.5" (1.74 m)    Wt 225 lb (102.1 kg)    SpO2 98%    BMI 33.71 kg/m   Physical  Exam Constitutional:      General: Not in acute distress.    Appearance: Normal appearance. Not ill-appearing.  HENT:     Head: Normocephalic and atraumatic.  Eyes:     Pupils: Pupils are equal, round.  Neck:     Musculoskeletal: Normal range of motion.  Cardiovascular:     Rate and Rhythm: Normal rate    Pulses: Normal pulses.  Pulmonary:     Effort: Pulmonary effort is normal. No respiratory distress.  Musculoskeletal: Normal range of motion.  Skin:    General: Skin is warm and dry.     Findings: No erythema or rash.  Neurological:     General: No focal deficit present.     Mental Status: Alert and oriented to person, place, and time. Mental status is at baseline.     Motor: No weakness.  Psychiatric:        Mood and Affect: Mood normal.        Behavior: Behavior normal.    Assessment/Plan: The patient is scheduled for bilateral upper and lower lid blepharoplasty, fat filling of bilateral earlobes with Dr. Ulice Bold.  Risks, benefits, and alternatives of procedure discussed, questions answered and consent obtained.    Smoking Status: ***; Counseling Given? ***  Caprini Score: ***; Risk Factors include: ***, BMI *** 25, and length of planned surgery. Recommendation for mechanical *** prophylaxis. Encourage early ambulation.   Pictures obtained: @consult   Post-op Rx sent to pharmacy:  No rx sent at this time, will send post-operatively or closer to surgical date  Patient was provided with the General Surgical Risk consent document and Pain Medication Agreement prior to their appointment.  They had adequate time to read through the risk consent documents and Pain Medication Agreement. We also discussed them in person together during this preop appointment. All of their questions were answered to their satisfaction.  Recommended calling if they have any further questions.  Risk consent form and Pain Medication Agreement to be scanned into patient's chart.  Patient was sent the  blepharoplasty surgical consent form via MyChart prior to her procedure, she confirmed receipt and confirmed that she read through this prior to her procedure today.  The risks that can be encountered with and after a blepharoplasty were discussed and include the following but no limited to these:  Asymmetry, dry eyes, lid lag, sensitivity to sun or bright light, difficulty closing your eyes, outward rolling of the eyelid, change in vision, fluid accumulation, firmness of the area, fat necrosis with death of fat tissue, bleeding, infection, delayed healing, anesthesia risks, skin sensation changes, injury to structures including nerves, blood  vessels, and muscles which may be temporary or permanent, hair loss, allergies to tape, suture materials and glues, blood products, topical preparations or injected agents, skin and contour irregularities, skin discoloration and swelling, deep vein thrombosis, cardiac and pulmonary complications, pain, which may persist, persistent pain, recurrence, poor healing of the incision, possible need for revisional surgery or staged procedures. Thiere can also be persistent swelling, poor wound healing, rippling or loose skin, swelling. Any change in weight fluctuations can alter the outcome.  Patient has been instructed to hold aspirin 81 mg 7 days prior to surgery.  She can restart 2 days postoperatively.  Cardiac clearance has been received by cardiology, patient is acceptable/low risk for upcoming surgery.  Electronically signed by: Kermit Balo Kelina Beauchamp, PA-C 08/09/2021 7:31 AM

## 2021-08-07 ENCOUNTER — Ambulatory Visit: Payer: Self-pay | Admitting: Surgical

## 2021-08-07 ENCOUNTER — Encounter: Payer: Self-pay | Admitting: Surgical

## 2021-08-07 ENCOUNTER — Other Ambulatory Visit: Payer: Self-pay

## 2021-08-07 VITALS — BP 146/83 | HR 71 | Ht 68.5 in | Wt 225.0 lb

## 2021-08-07 DIAGNOSIS — Z719 Counseling, unspecified: Secondary | ICD-10-CM

## 2021-08-08 ENCOUNTER — Encounter: Payer: Self-pay | Admitting: Plastic Surgery

## 2021-08-10 NOTE — Telephone Encounter (Signed)
We spoke for some time yesterday and then again today.  We discussed the risks and complications of the surgery.  Specifically blindness although extremely rare.  The other complications include dry eyes, corneal irritation temporary, hollowing look if too much fat is removed and scleral show if too much skin is removed.  I expressed to the patient I support what ever decision and she makes and if that is going to a different provider I support her.  We do not offer the risk complications insurance which other practices offer.  After extensive conversation the patient has decided to stick with [place] for the surgery. ?

## 2021-08-14 ENCOUNTER — Institutional Professional Consult (permissible substitution): Payer: No Typology Code available for payment source | Admitting: Plastic Surgery

## 2021-08-19 ENCOUNTER — Other Ambulatory Visit: Payer: Self-pay | Admitting: Cardiology

## 2021-08-19 DIAGNOSIS — R931 Abnormal findings on diagnostic imaging of heart and coronary circulation: Secondary | ICD-10-CM

## 2021-08-19 DIAGNOSIS — I700 Atherosclerosis of aorta: Secondary | ICD-10-CM

## 2021-08-21 HISTORY — PX: BELPHAROPTOSIS REPAIR: SHX369

## 2021-08-23 ENCOUNTER — Encounter: Payer: Self-pay | Admitting: Surgical

## 2021-08-30 ENCOUNTER — Other Ambulatory Visit: Payer: Self-pay | Admitting: Surgical

## 2021-08-30 MED ORDER — CEPHALEXIN 500 MG PO CAPS: 500.0000 mg | ORAL_CAPSULE | Freq: Four times a day (QID) | ORAL | 0 refills | Status: AC

## 2021-08-30 MED ORDER — HYDROCODONE-ACETAMINOPHEN 5-325 MG PO TABS: 1.0000 | ORAL_TABLET | Freq: Four times a day (QID) | ORAL | 0 refills | Status: AC | PRN

## 2021-08-30 MED ORDER — ONDANSETRON HCL 4 MG PO TABS: 4.0000 mg | ORAL_TABLET | Freq: Three times a day (TID) | ORAL | 0 refills | Status: DC | PRN

## 2021-08-30 NOTE — Progress Notes (Signed)
Post op meds 

## 2021-09-11 ENCOUNTER — Ambulatory Visit (INDEPENDENT_AMBULATORY_CARE_PROVIDER_SITE_OTHER): Payer: Self-pay | Admitting: Plastic Surgery

## 2021-09-11 DIAGNOSIS — Z719 Counseling, unspecified: Secondary | ICD-10-CM

## 2021-09-11 NOTE — Progress Notes (Signed)
The patient is a 66 year old female here for follow-up on her upper and lower lid blepharoplasty.  She has a lot of swelling and bruising.  I removed the stitches from her upper eyelids.  She has a little laxity of the right upper lid.  This is to be expected.  She has a lot of swelling of both lower lids as well.  I would like to see her back in 1 week.  We also talked about injecting her ears when she can participate in amount to be injected. ?

## 2021-09-24 NOTE — Progress Notes (Signed)
Patient is a 66 year old female here for follow-up on her upper and lower lid blepharoplasty with Dr. Ulice Bold on 08/30/2021.  She is nearly 4 weeks postop.  Per recent note on 09/11/2021 she has had a lot of swelling and bruising.  She had some laxity of the right upper lid. ? ?Patient reports she is happy with the way the upper eyelids have healed, she reports she has normal vision, she reports she is able to close her eyes completely.  She reports she feels as if she has some catching of the left upper eyelid when closing her eyes.  She is bothered by the lower lids, feels as if she does not notice much change.  She is hopeful that this will improve. ? ?On exam bilateral upper eyelid incisions are intact and well-healing.  No bruising or ecchymosis is noted.  Bilateral lower eyelids are swollen, right greater than left.  She is able to close her eyes completely.  Normal pupils.  Facial nerve function is intact. ? ?Discussed with patient she has a lot of swelling still from surgery, suspect she will notice some improvement over the next few weeks as swelling continues to improve.  She can use ibuprofen as needed to help with inflammation and swelling.  We discussed all of her concerns today.  We also took a picture and placed it in her chart with her permission.  I recommend she call with questions or concerns.  She has a follow-up with Dr. Ulice Bold in a week to discuss fat filling of bilateral earlobes with filler. ?

## 2021-09-25 ENCOUNTER — Ambulatory Visit (INDEPENDENT_AMBULATORY_CARE_PROVIDER_SITE_OTHER): Payer: Self-pay | Admitting: Surgical

## 2021-09-25 DIAGNOSIS — Z719 Counseling, unspecified: Secondary | ICD-10-CM

## 2021-09-25 NOTE — Addendum Note (Signed)
Addended by: Drema Dallas K on: 09/25/2021 12:17 PM ? ? Modules accepted: Orders ? ?

## 2021-10-02 ENCOUNTER — Encounter: Payer: No Typology Code available for payment source | Admitting: Plastic Surgery

## 2021-10-12 ENCOUNTER — Encounter: Payer: No Typology Code available for payment source | Admitting: Plastic Surgery

## 2021-10-15 ENCOUNTER — Ambulatory Visit (INDEPENDENT_AMBULATORY_CARE_PROVIDER_SITE_OTHER): Payer: Self-pay | Admitting: Plastic Surgery

## 2021-10-15 ENCOUNTER — Encounter: Payer: Self-pay | Admitting: Plastic Surgery

## 2021-10-15 DIAGNOSIS — Z719 Counseling, unspecified: Secondary | ICD-10-CM

## 2021-10-15 NOTE — Progress Notes (Signed)
Filler Injection Procedure Note ? ?Procedure:  Filler administration ? ?Pre-operative Diagnosis: Rytides and volume loss to ear lobes ? ?Post-operative Diagnosis: Same ? ?Brief history: The patient desires injection with fillers in her face. I discussed with the patient this proposed procedure of filler injections, which is customized depending on the particular needs of the patient. It is performed on facial volume loss as a temporary correction. The alternatives were discussed with the patient. The risks were addressed including bleeding, scarring, infection, damage to deeper structures, asymmetry, and chronic pain, which may occur infrequently after a procedure. The individual's choice to undergo a surgical procedure is based on the comparison of risks to potential benefits. Other risks include unsatisfactory results, allergic reaction, which should go away with time, bruising and delayed healing. Fillers do not arrest the aging process or produce permanent tightening.  Operative intervention maybe necessary to maintain the results. The patient understands and wishes to proceed. An informed consent was signed and informational brochures given to her prior to the procedure. ? ?Procedure: The area was prepped with alcohol and dried with a clean gauze. Using a clean technique, a 30 gauge needle was then used to inject the filler into the ear lobes. This was done with one syringe 0.6 cc.  No complications were noted. Light pressure was held for 5 minutes. She was instructed explicitly in post-operative care. ? ?Restylane Refyne ?LOT: [id number] ?EXP: 2022-01-31 ? ?Pictures were obtained of the patient and placed in the chart with the patient's or guardian's permission. ? ? ? ?

## 2021-11-11 ENCOUNTER — Other Ambulatory Visit: Payer: Self-pay | Admitting: Cardiology

## 2021-11-11 DIAGNOSIS — R931 Abnormal findings on diagnostic imaging of heart and coronary circulation: Secondary | ICD-10-CM

## 2021-11-11 DIAGNOSIS — I700 Atherosclerosis of aorta: Secondary | ICD-10-CM

## 2021-11-12 ENCOUNTER — Encounter: Payer: Self-pay | Admitting: Plastic Surgery

## 2021-11-12 ENCOUNTER — Ambulatory Visit (INDEPENDENT_AMBULATORY_CARE_PROVIDER_SITE_OTHER): Payer: Self-pay | Admitting: Plastic Surgery

## 2021-11-12 DIAGNOSIS — Z719 Counseling, unspecified: Secondary | ICD-10-CM

## 2021-11-12 NOTE — Progress Notes (Signed)
The patient is a 66 year old female here for follow-up after undergoing upper and lower lid blepharoplasty and earlobe injections.  She looks much better.  The left side is settling out very nicely but has some darkening.  The right side still has a little bit of swelling.  She is pleased with the earlobes but thinks she might want a little more filler in the left earlobe.  She would also like some Botox.  She is going to see me in 4 to 6 weeks and we will probably do more left earlobe filler and Botox and then a trial of laser for under her eyes.  Pictures were obtained of the patient and placed in the chart with the patient's or guardian's permission.  Pictures were obtained of the patient and placed in the chart with the patient's or guardian's permission.

## 2021-12-13 ENCOUNTER — Encounter: Payer: Self-pay | Admitting: Plastic Surgery

## 2021-12-25 ENCOUNTER — Ambulatory Visit (INDEPENDENT_AMBULATORY_CARE_PROVIDER_SITE_OTHER): Payer: Self-pay | Admitting: Plastic Surgery

## 2021-12-25 ENCOUNTER — Encounter: Payer: Self-pay | Admitting: Plastic Surgery

## 2021-12-25 DIAGNOSIS — Z719 Counseling, unspecified: Secondary | ICD-10-CM

## 2021-12-25 MED ORDER — HYDROQUINONE 4 % EX CREA: TOPICAL_CREAM | Freq: Two times a day (BID) | CUTANEOUS | 0 refills | Status: AC

## 2021-12-25 NOTE — Progress Notes (Signed)
   Subjective:    Patient ID: Alice Castaneda, female    DOB: 01/13/1956, 66 y.o.   MRN: [id number]  The patient is a 66 year old female who joined me by phone.  She is interested in a little more filler on the left ear.  She is happy with the right ear.  She would like to move ahead with possible laser to the her lower eyelids.  She would like to talk more about it at her next visit and she would also like some Botox.      Review of Systems  Constitutional: Negative.   Eyes: Negative.   Respiratory: Negative.    Cardiovascular: Negative.   Endocrine: Negative.        Objective:   Physical Exam      Assessment & Plan:     ICD-10-CM   1. Encounter for counseling  Z71.9        I connected with  Alice Castaneda on 12/25/21 by phone and verified that I am speaking with the correct person using two identifiers.  Was at home and I was at the office.  We spent 5 minutes in discussion.   I discussed the limitations of evaluation and management by telemedicine. The patient expressed understanding and agreed to proceed.  She will plan on left ear filler placement and Botox at her next visit and I will call in the hydroquinone.

## 2022-01-04 ENCOUNTER — Ambulatory Visit: Payer: No Typology Code available for payment source | Admitting: Plastic Surgery

## 2022-01-11 ENCOUNTER — Encounter: Payer: Self-pay | Admitting: Plastic Surgery

## 2022-01-11 ENCOUNTER — Ambulatory Visit (INDEPENDENT_AMBULATORY_CARE_PROVIDER_SITE_OTHER): Payer: Self-pay | Admitting: Plastic Surgery

## 2022-01-11 DIAGNOSIS — Z719 Counseling, unspecified: Secondary | ICD-10-CM

## 2022-01-11 NOTE — Progress Notes (Signed)
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 I discussed with the patient this proposed procedure of botulinum toxin injections, which is customized depending on the particular needs of the patient. It is performed on facial rhytids as a temporary correction. The alternatives were discussed with the patient. The risks were addressed including bleeding, scarring, infection, damage to deeper structures, asymmetry, and chronic pain, which may occur infrequently after a procedure. The individual's choice to undergo a surgical procedure is based on the comparison of risks to potential benefits. Other risks include unsatisfactory results, brow ptosis, eyelid ptosis, allergic reaction, temporary paralysis, which should go away with time, bruising, blurring disturbances and delayed healing. Botulinum toxin injections do not arrest the aging process or produce permanent tightening of the eyelid.  Operative intervention maybe necessary to maintain the results of a blepharoplasty or botulinum toxin. The patient understands and wishes to proceed.  Procedure: The area was prepped with alcohol and dried with a clean gauze. Using a clean technique, the botulinum toxin was diluted with 1.25 cc of preservative-free normal saline which was slowly injected with an 18 gauge needle in a tuberculin syringes.  A 32 gauge needles were then used to inject the botulinum toxin. This mixture allow for an aliquot of 4 units per 0.1 cc in each injection site.    Subsequently the mixture was injected in the glabellar and forehead area with preservation of the temporal branch to the lateral eyebrow. A total of 20 Units of botulinum toxin was used. The forehead and  glabellar area was injected with care to inject intramuscular only while holding pressure on the supratrochlear vessels in each area during each injection on either side of the medial corrugators. The injection proceeded vertically superiorly to the medial 2/3 of the frontalis muscle and superior 2/3 of the lateral frontalis, again with preservation of the frontal branch.  The left ear lobe was injected with the Vobella 0.3 cc and the right lobe with 0.1 cc.  No complications were noted. Light pressure was held for 5 minutes. She was instructed explicitly in post-operative care.  Botox LOT:  [id number] EXP:  25/09  J. Volbella XC  0.4 cc

## 2022-02-08 ENCOUNTER — Ambulatory Visit (INDEPENDENT_AMBULATORY_CARE_PROVIDER_SITE_OTHER): Payer: Self-pay | Admitting: Plastic Surgery

## 2022-02-08 ENCOUNTER — Encounter: Payer: Self-pay | Admitting: Plastic Surgery

## 2022-02-08 DIAGNOSIS — Z719 Counseling, unspecified: Secondary | ICD-10-CM

## 2022-02-08 NOTE — Progress Notes (Signed)
   Subjective:    Patient ID: Alice Castaneda, female    DOB: 12-May-1956, 66 y.o.   MRN: [id number]  The patient is a 66 year old female here for follow-up on her bilateral upper and lower lid blepharoplasties.  She has done fabulous with the upper lids.  I can barely see any scar at all.  The lower lids have improved markedly over the last couple of months.  There is asymmetry that I do notice.  She has fat pad on the right that she does not have on the left.  She wants to know if there is anything that can be done to improve this.  I thought about doing the halo and did test dose with the spot laser on her arm.  That does not look like it would be a good idea.      Review of Systems  Constitutional: Negative.   Eyes: Negative.   Respiratory: Negative.    Cardiovascular: Negative.   Gastrointestinal: Negative.   Endocrine: Negative.   Genitourinary: Negative.        Objective:   Physical Exam Constitutional:      Appearance: Normal appearance.  Cardiovascular:     Rate and Rhythm: Normal rate.     Pulses: Normal pulses.  Skin:    Capillary Refill: Capillary refill takes less than 2 seconds.  Neurological:     Mental Status: She is alert and oriented to person, place, and time.  Psychiatric:        Mood and Affect: Mood normal.        Behavior: Behavior normal.        Thought Content: Thought content normal.        Judgment: Judgment normal.        Assessment & Plan:     ICD-10-CM   1. Encounter for counseling  Z71.9        I will ask Dr. Domenica Reamer to take a look and weigh in on his thoughts.   Botulinum Toxin Procedure Note  Procedure: Cosmetic botulinum toxin   Pre-operative Diagnosis: Dynamic rhytides   Post-operative Diagnosis: Same  Complications:  None  Procedure: The area was prepped with alcohol and dried with a clean gauze. Using a clean technique, the botulinum toxin was diluted with 1.25 cc of preservative-free normal saline which was slowly  injected with an 18 gauge needle in a tuberculin syringes.  A 32 gauge needles were then used to inject the botulinum toxin. This mixture allow for an aliquot of 4 units per 0.1 cc in each injection site.    The patient wanted the forehead to calm down and the glabella area to relax.  A total of 8 units were used.  I injected at the left forehead and glabella area.    Botox LOT:  [id number]  EXP:  25/9

## 2022-02-12 ENCOUNTER — Ambulatory Visit (INDEPENDENT_AMBULATORY_CARE_PROVIDER_SITE_OTHER): Payer: Self-pay | Admitting: Plastic Surgery

## 2022-02-12 ENCOUNTER — Encounter: Payer: Self-pay | Admitting: Plastic Surgery

## 2022-02-12 DIAGNOSIS — Z719 Counseling, unspecified: Secondary | ICD-10-CM

## 2022-02-12 NOTE — Progress Notes (Signed)
The patient is a 66 year old female here for further discussion.  I let the patient know that Dr. Domenica Reamer is going to render his opinion and we will see her in October.  Patient is pleased with that and we will wait for his opinion.

## 2022-03-13 ENCOUNTER — Other Ambulatory Visit: Payer: Self-pay | Admitting: Cardiology

## 2022-03-13 DIAGNOSIS — R931 Abnormal findings on diagnostic imaging of heart and coronary circulation: Secondary | ICD-10-CM

## 2022-03-13 DIAGNOSIS — I700 Atherosclerosis of aorta: Secondary | ICD-10-CM

## 2022-03-14 ENCOUNTER — Ambulatory Visit (INDEPENDENT_AMBULATORY_CARE_PROVIDER_SITE_OTHER): Payer: Self-pay | Admitting: Plastic Surgery

## 2022-03-14 ENCOUNTER — Encounter: Payer: Self-pay | Admitting: *Deleted

## 2022-03-14 DIAGNOSIS — H02834 Dermatochalasis of left upper eyelid: Secondary | ICD-10-CM

## 2022-03-14 DIAGNOSIS — H02831 Dermatochalasis of right upper eyelid: Secondary | ICD-10-CM

## 2022-03-17 NOTE — Progress Notes (Signed)
Status post upper and lower blepharoplasty by Dr. Marla Roe.  Patient is very happy with her upper blepharoplasty but notes that she has some asymmetry in skin folds on her lower eyelids.  Physical exam Scars very well-healed upper blepharoplasty.  Lower blepharoplasty note some increased fullness on the right compared to left.  Patient also has some wrinkles that she had preoperatively.  Assessment and plan Patient is concerned about some increased fullness of right lower lid.  We discussed options. 1) discussed the possibility of performing transcutaneous blepharoplasty which would allow direct visualization of fat for better symmetry and also the possibility of smoothing out her lower eyelid wrinkles and we could raise her lid cheek junction.  She did heal her upper eyelid scars very well but she has very smooth skin in her lateral orbital region so scarring could be visible.  Due to the possibility of visible scarring she is uncertain about this option.  She is also going on a trip in November so did not want to do this immediately.  2) if she decides that she only wants to try to even fat out repeat trans conjunctival blepharoplasty could be an option.  We discussed that especially with preoperative fat asymmetry getting perfectly even fat distribution can be difficult with this approach.  3) patient is still relatively early postoperative so if she wants to continue observation and recheck at 1 year this is also reasonable.  She is going to consider options.

## 2022-06-07 ENCOUNTER — Other Ambulatory Visit: Payer: Self-pay | Admitting: Cardiology

## 2022-06-07 DIAGNOSIS — R931 Abnormal findings on diagnostic imaging of heart and coronary circulation: Secondary | ICD-10-CM

## 2022-06-07 DIAGNOSIS — I700 Atherosclerosis of aorta: Secondary | ICD-10-CM

## 2022-07-10 ENCOUNTER — Encounter: Payer: Self-pay | Admitting: Family

## 2022-07-11 ENCOUNTER — Other Ambulatory Visit: Payer: Self-pay | Admitting: Internal Medicine

## 2022-07-11 DIAGNOSIS — Z87891 Personal history of nicotine dependence: Secondary | ICD-10-CM

## 2022-07-13 IMAGING — CT CT CARDIAC CORONARY ARTERY CALCIUM SCORE
3 series · 14 of 20 positions shown, 16 images · non-contrast
Comparison: None.

CLINICAL DATA: 64-year-old African American female with family
history of coronary artery disease.

EXAM:
CT CARDIAC CORONARY ARTERY CALCIUM SCORE
TECHNIQUE: Non-contrast imaging through the heart was performed using
prospective ECG gating. Image post processing was performed on an
independent workstation, allowing for quantitative analysis of the
heart and coronary arteries. Note that this exam targets the heart
and the chest was not imaged in its entirety.

[Series 2: calcium scoring 2.00 qr36 bestdiast 69% hrt calciu · axial · 0.41mm/px · z∈[+1610,+1682]mm · 4 of 60 slices shown]
[im 12/60  vessel]
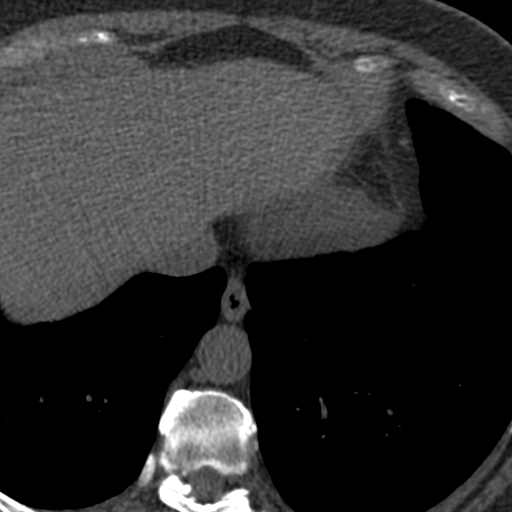
[im 24/60  vessel]
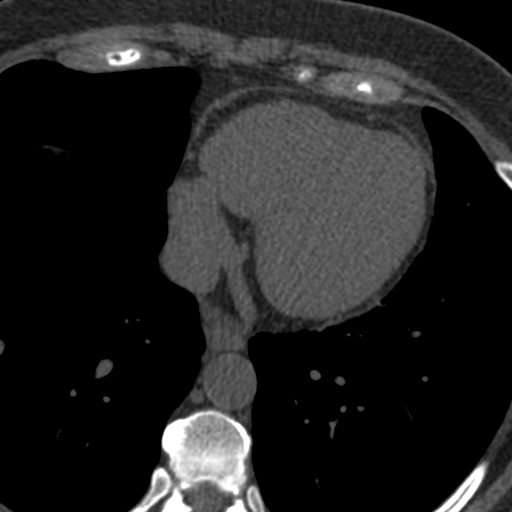
[im 36/60  vessel]
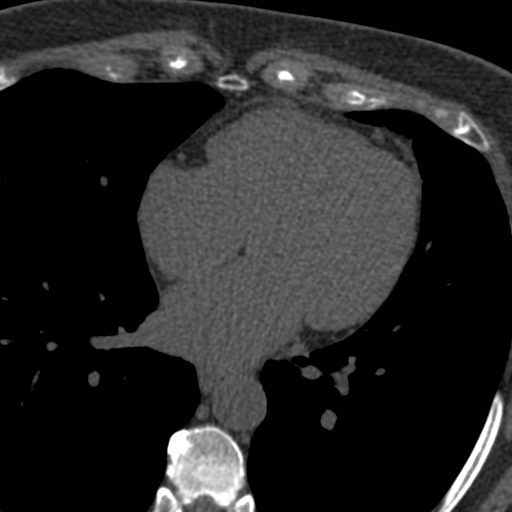
[im 48/60  vessel]
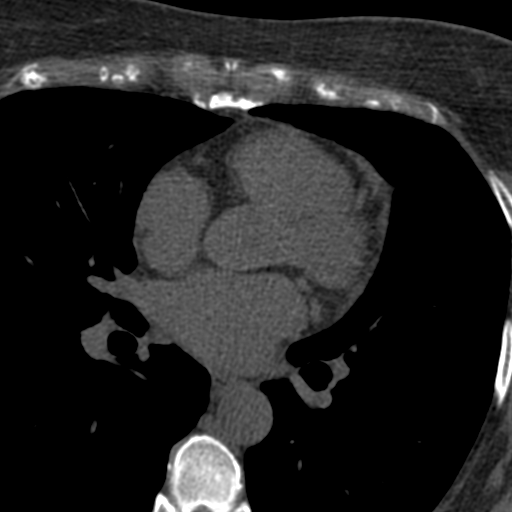

[Series 3: calcium scoring 2.00 br40 bestdiast 69% axial · axial · 0.67mm/px · z∈[+1606,+1686]mm · 5 of 60 slices shown, 7 images]
[im 10/60  vessel]
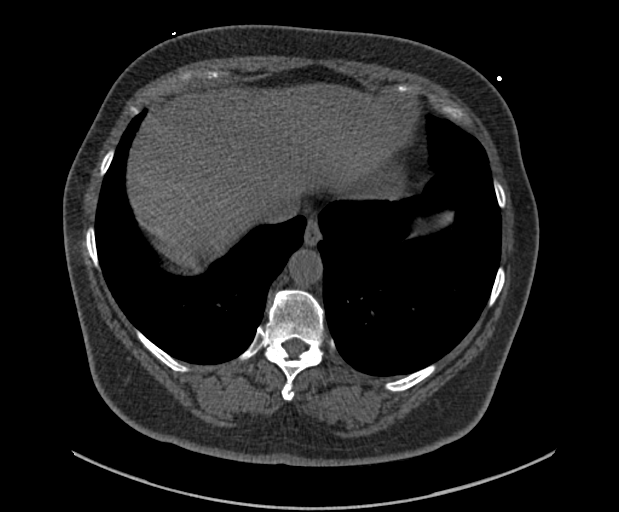
[im 10/60  lung]
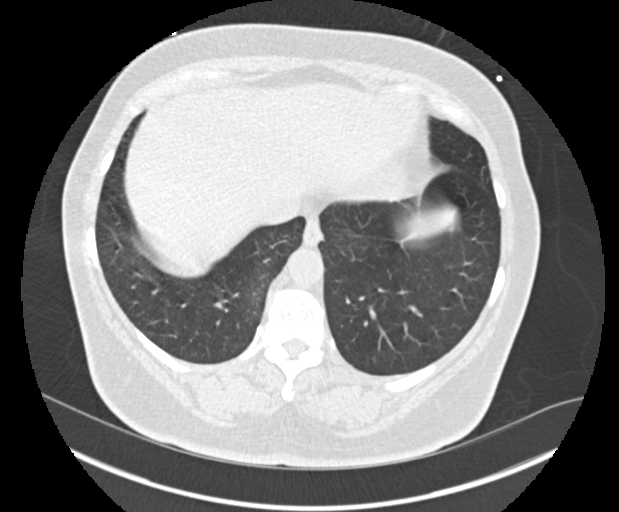
[im 20/60  vessel]
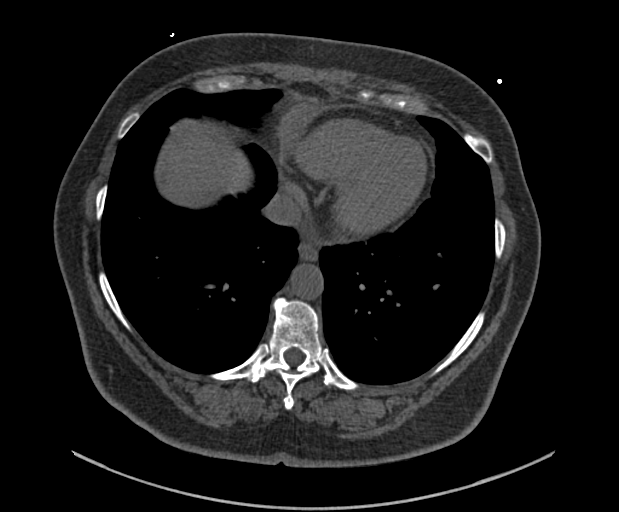
[im 30/60  vessel]
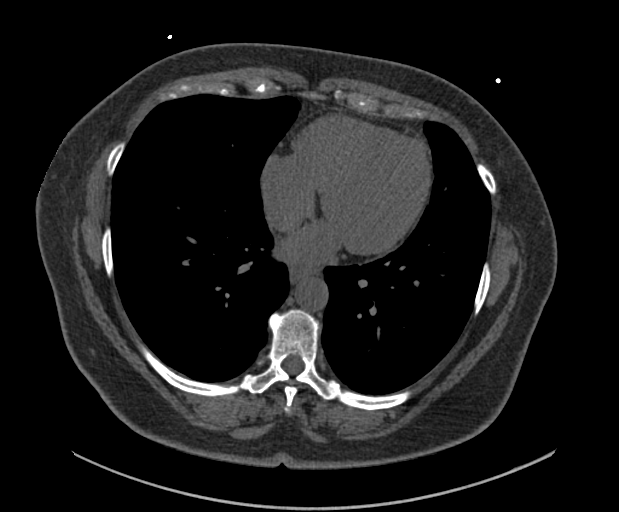
[im 40/60  vessel]
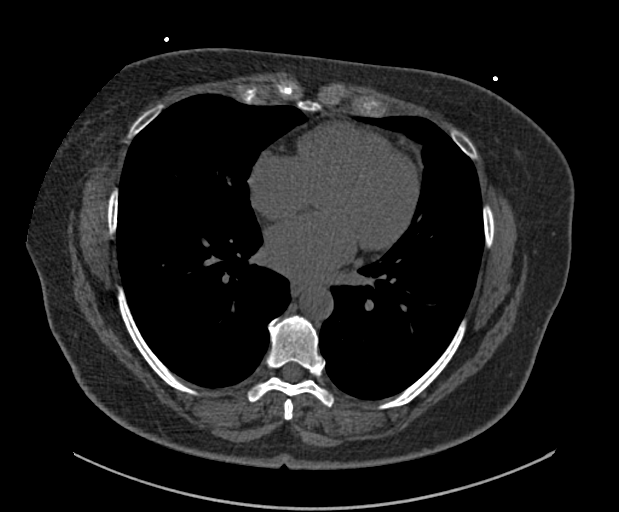
[im 50/60  vessel]
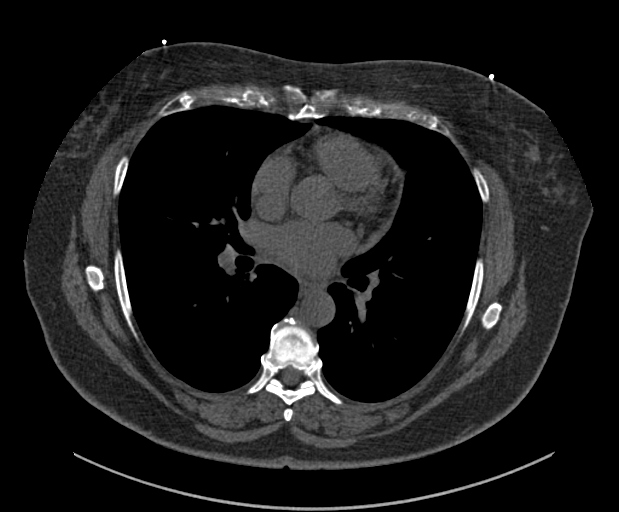
[im 50/60  lung]
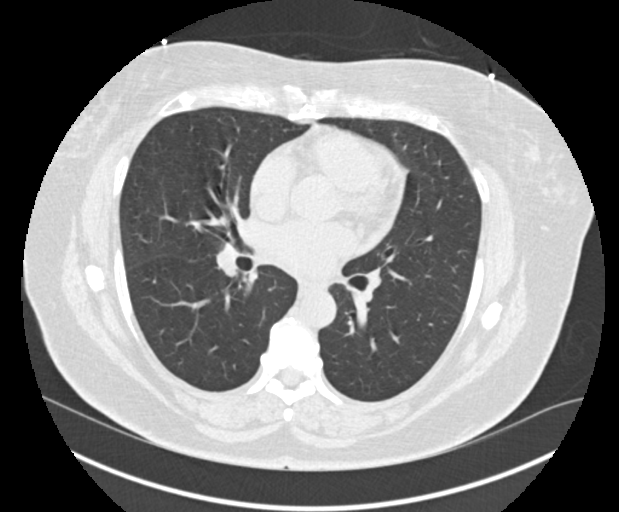

[Series 9: calcium scoring 2.00 br60 bestdiast 69% lungs · axial · 0.65mm/px · z∈[+1606,+1686]mm · 5 of 60 slices shown]
[im 10/60  vessel]
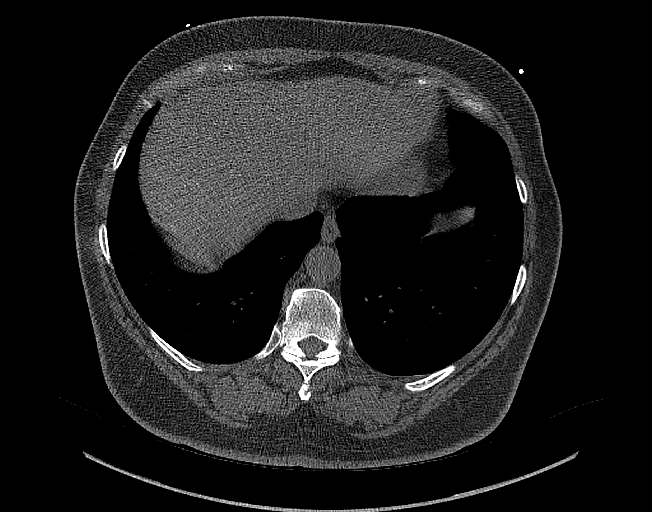
[im 20/60  vessel]
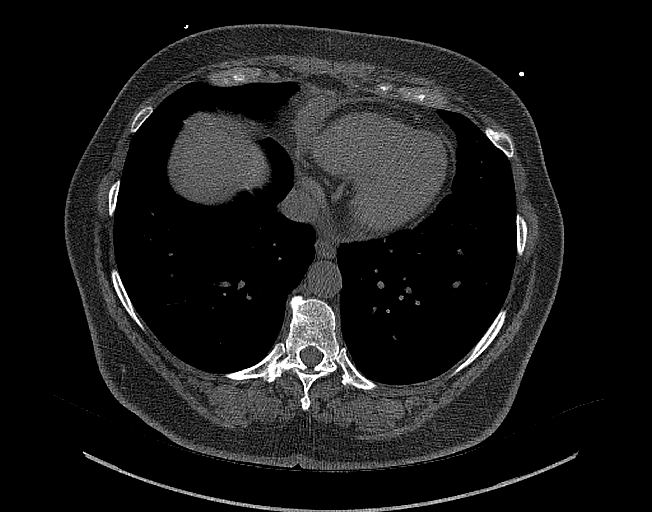
[im 30/60  vessel]
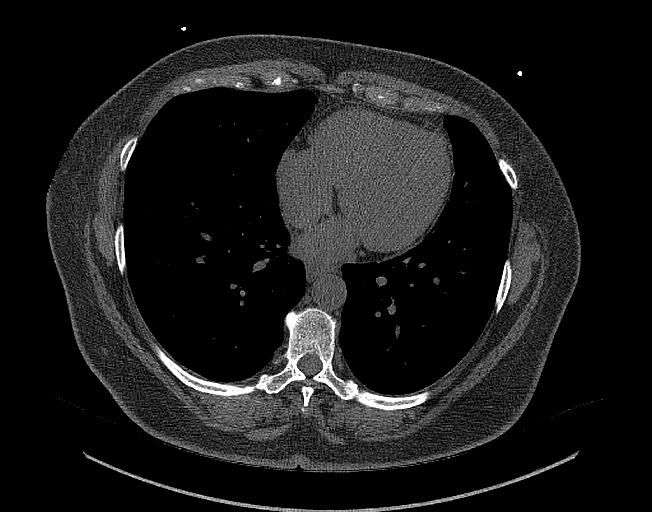
[im 40/60  vessel]
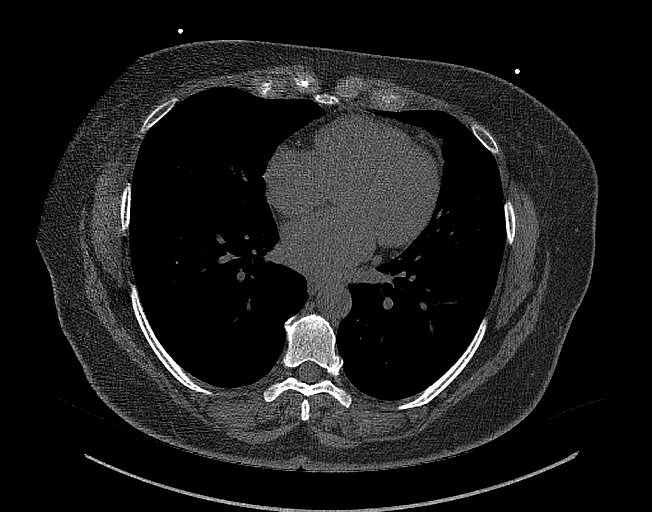
[im 50/60  vessel]
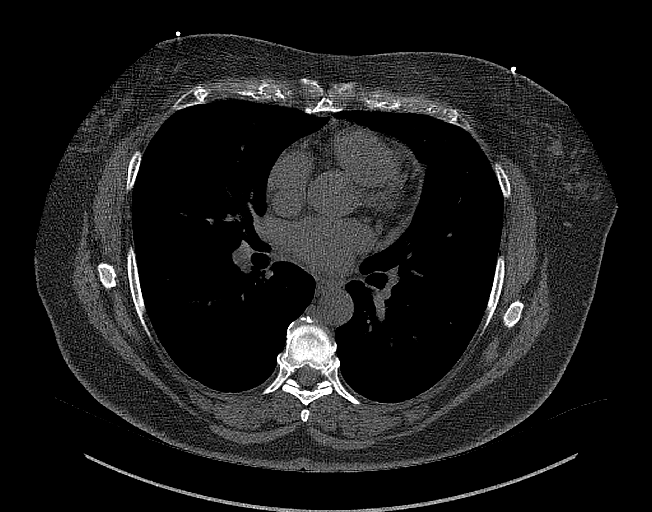

[14 of 20 positions shown; findings below may reference images not displayed]

FINDINGS: CORONARY CALCIUM SCORES:

Left Main: 0

LAD:

LCx: 0

RCA: 0

Total Agatston Score:

[HOSPITAL] percentile: 86

AORTA MEASUREMENTS:

Ascending Aorta: 29 mm

Descending Aorta: 24 mm

OTHER FINDINGS:

Heart size is normal. No significant pericardial fluid. Small amount
of calcium involving the aortic root. Visualized mediastinal
structures are normal. 1.6 cm low-density structure along the
anterior liver is suggestive for a hepatic cyst. 2 mm nodule in the
right upper lobe on sequence 9, image 4. Tiny nodule along the right
major fissure on image 15. No large areas of airspace disease or
consolidation in lungs. No large pleural effusions. No acute bone
abnormality.
IMPRESSION: 1. Coronary calcium score is 62.7 and this is at percentile 86 for
patients of the same age, gender and ethnicity.
2. Small indeterminate pulmonary nodules. No follow-up needed if
patient is low-risk (and has no known or suspected primary
neoplasm). Non-contrast chest CT can be considered in 12 months if
patient is high-risk. This recommendation follows the consensus
statement: Guidelines for Management of Incidental Pulmonary Nodules
Detected on CT Images: From the [HOSPITAL] 6027; Radiology
3.  Aortic Atherosclerosis ([id number]-FOL.L).

## 2022-07-28 NOTE — Progress Notes (Signed)
External Labs: Collected: 07/02/2022 provided by PCP. Hemoglobin 14.5, hematocrit 44.5% AST 14, ALT 21, alkaline phosphatase 67. BUN 15, creatinine 0.91. Sodium 139, potassium 3.8, chloride 103, bicarb 26 Total cholesterol 120, triglycerides 74, HDL 58, LDL 47, non-HDL 62. TSH 0.94   Aleksis Jiggetts Curtis, DO, [facility]

## 2022-08-06 ENCOUNTER — Ambulatory Visit: Payer: Managed Care, Other (non HMO) | Admitting: Cardiology

## 2022-08-06 ENCOUNTER — Encounter: Payer: Self-pay | Admitting: Cardiology

## 2022-08-06 VITALS — BP 134/75 | HR 70 | Ht 68.5 in | Wt 234.0 lb

## 2022-08-06 DIAGNOSIS — I10 Essential (primary) hypertension: Secondary | ICD-10-CM

## 2022-08-06 DIAGNOSIS — I700 Atherosclerosis of aorta: Secondary | ICD-10-CM

## 2022-08-06 DIAGNOSIS — R931 Abnormal findings on diagnostic imaging of heart and coronary circulation: Secondary | ICD-10-CM

## 2022-08-06 NOTE — Progress Notes (Signed)
Date:  05/24/2021   ID:  Alice Castaneda, DOB [birth year], MRN [id number]  PCP:  Deland Pretty, MD  Cardiologist:  Rex Kras, DO, [facility] (established care 05/24/2021)  Date: 08/06/22 Last Office Visit: 05/24/2021  Chief Complaint  Patient presents with   Follow-up    Coronary calcification-1 year follow-up visit    HPI  Alice Castaneda is a 67 y.o. African-American female whose past medical history and cardiovascular risk factors include: Mild coronary artery calcification (total CAC 62.7, 86 percentile), aortic atherosclerosis, hx of fen-phen utilization, former smoker, pulmonary nodule, family history of heart disease.  Initially referred to the practice for evaluation of CAD.  She was noted to have mild CAC with a total score of 62.7 placing her at the 86 percentile.  She underwent ischemic workup as outlined below and now presents today for 1 year follow-up visit.  Since last office visit she denies exertional chest pain.  Her dyspnea on exertion remains relatively stable.  Her average blood pressure at home is 130/70 (per patient).   Also had labs from January 2024 independently reviewed and noted below for further reference.   FUNCTIONAL STATUS: No structured exercise program or daily routine.   ALLERGIES: No Known Allergies  MEDICATION LIST PRIOR TO VISIT: Current Meds  Medication Sig   atorvastatin (LIPITOR) 20 MG tablet TAKE 1 TABLET BY MOUTH ONCE DAILY AT BEDTIME   Calcium 250 MG CAPS as directed Orally   Cholecalciferol 50 MCG (2000 UT) TABS 1 tablet Orally Once a day for 30 day(s)   Coenzyme Q10 10 MG capsule as directed Orally   losartan-hydrochlorothiazide (HYZAAR) 100-25 MG tablet Take 1 tablet by mouth daily.   mirabegron ER (MYRBETRIQ) 50 MG TB24 tablet Take 50 mg by mouth daily.     PAST MEDICAL HISTORY: Past Medical History:  Diagnosis Date   Agatston coronary artery calcium score less than 100    Aortic atherosclerosis (HCC)    Hypertension     Obesity due to excess calories     PAST SURGICAL HISTORY: Past Surgical History:  Procedure Laterality Date   BELPHAROPTOSIS REPAIR Bilateral 08/21/2021    FAMILY HISTORY: The patient family history includes Breast cancer in her maternal grandmother and mother; Diabetes in her father and sister; Heart failure in her mother; Stroke in her father.  SOCIAL HISTORY:  The patient  reports that she has quit smoking. Her smoking use included cigarettes. She has a 5.00 pack-year smoking history. She has never used smokeless tobacco. She reports current alcohol use of about 3.0 standard drinks of alcohol per week. She reports that she does not use drugs.  REVIEW OF SYSTEMS: Review of Systems  Constitutional: Negative for chills and fever.  HENT:  Negative for hoarse voice and nosebleeds.   Eyes:  Negative for discharge, double vision and pain.  Cardiovascular:  Positive for dyspnea on exertion (chronic and stable). Negative for chest pain, claudication, leg swelling, near-syncope, orthopnea, palpitations, paroxysmal nocturnal dyspnea and syncope.  Respiratory:  Negative for hemoptysis and shortness of breath.   Musculoskeletal:  Negative for muscle cramps and myalgias.  Gastrointestinal:  Negative for abdominal pain, constipation, diarrhea, hematemesis, hematochezia, melena, nausea and vomiting.  Neurological:  Negative for dizziness and light-headedness.    PHYSICAL EXAM:    08/06/2022   10:34 AM 08/07/2021   10:15 AM 07/23/2021    1:51 PM  Vitals with BMI  Height 5' 8.5" 5' 8.5" '5\' 8"'$   Weight 234 lbs 225 lbs 230 lbs  BMI  35.06 Q000111Q [id number]  Systolic Q000111Q 123456 [id number]  Diastolic 75 83 76  Pulse 70 71 63   Physical Exam  Constitutional: No distress.  Age appropriate, hemodynamically stable.   Neck: No JVD present.  Cardiovascular: Normal rate, regular rhythm, S1 normal, S2 normal, intact distal pulses and normal pulses. Exam reveals no gallop, no S3 and no S4.  No murmur  heard. Pulmonary/Chest: Effort normal and breath sounds normal. No stridor. She has no wheezes. She has no rales.  Abdominal: Soft. Bowel sounds are normal. She exhibits no distension. There is no abdominal tenderness.  Musculoskeletal:        General: No edema.     Cervical back: Neck supple.  Neurological: She is alert and oriented to person, place, and time. She has intact cranial nerves (2-12).  Skin: Skin is warm and moist.   CARDIAC DATABASE: EKG: 08/06/2022: Sinus rhythm, 62 bpm, without underlying ischemia or injury pattern.  Echocardiogram: 06/19/2021: Left ventricle cavity is normal in size and wall thickness. Normal global wall motion. Normal LV systolic function with EF 61%. Normal diastolic filling pattern.  Trace MR, trace TR. No evidence of pulmonary hypertension.  Stress Testing: Exercise Sestamibi stress test 06/18/2021: Exercise time 5 minutes 55 seconds on Bruce protocol, achieved 7.05 METS, 91% of APMHR. Stress ECG negative for ischemia. Normal myocardial perfusion without convincing evidence of reversible myocardial ischemia or prior infarct. Calculated LVEF preserved, 78%. Left ventricular wall thickness preserved without regional wall motion abnormalities. No prior studies available for comparison. Low risk study.  Heart Catheterization: None  Coronary artery calcium scoring: 03/22/2021: Left Main: 0   LAD: 62.7   LCx: 0   RCA: 0   Total Agatston Score: 62.7   MESA database percentile: 86   AORTA MEASUREMENTS: Ascending Aorta: 29 mm Descending Aorta: 24 mm 1. Coronary calcium score is 62.7 and this is at percentile 86 for patients of the same age, gender and ethnicity. 2. Small indeterminate pulmonary nodules. No follow-up needed if patient is low-risk (and has no known or suspected primary neoplasm). Non-contrast chest CT can be considered in 12 months if patient is high-risk. This recommendation follows the consensus statement: Guidelines for  Management of Incidental Pulmonary Nodules Detected on CT Images: From the Fleischner Society 2017; Radiology 2017; 284:228-243. 3.  Aortic Atherosclerosis (ICD10-I70.0).  LABORATORY DATA: External Labs: Collected:  Total cholesterol 151, HDL 58, LDL 76, triglycerides 85 Hemoglobin 13.7 g/dL, hematocrit 43.4% Sodium 144, potassium 4.3, chloride 108, bicarb 28, BUN 19, creatinine 1.17 mg/dL. eGFR 57 TSH 1.74  External Labs: Collected: 07/02/2022 provided by PCP. Hemoglobin 14.5, hematocrit 44.5% AST 14, ALT 21, alkaline phosphatase 67. BUN 15, creatinine 0.91. Sodium 139, potassium 3.8, chloride 103, bicarb 26 Total cholesterol 120, triglycerides 74, HDL 58, LDL 47, non-HDL 62. TSH 0.94   IMPRESSION:    ICD-10-CM   1. Agatston coronary artery calcium score less than 100  R93.1 EKG 12-Lead    2. Aortic atherosclerosis (HCC)  I70.0     3. Benign hypertension  I10     4. Class 2 severe obesity due to excess calories with serious comorbidity and body mass index (BMI) of 35.0 to 35.9 in adult [facility])  E66.01    Z68.35        RECOMMENDATIONS: Alice Castaneda is a 67 y.o. African-American female whose past medical history and cardiac risk factors include: Mild coronary artery calcification (total CAC 62.7, 86 percentile), aortic atherosclerosis, hx of fen-phen utilization, former smoker, pulmonary nodule, family history  of heart disease.  Patient presents today for 1 year follow-up visit given her history of coronary artery calcification and dyspnea on exertion.  She is doing well from a cardiovascular standpoint over the last 1 year.  She denies anginal discomfort or heart failure symptoms.  Her dyspnea on exertion is relatively stable.  Most recent labs from January 2024 independently reviewed and noted above for further reference.  LDL and triglyceride levels are at goal.  She has gained weight since last office visit and wanted to be considered for weight loss medications.  Her  PCP currently would like her to hold off and participate in improving her modifiable cardiovascular risk factors and increasing physical activity.  I agreed with her that lifestyle modifications are cornerstone and weight loss medications should be used as adjunctive therapy.  I have asked her to increase her physical activity to 30 minutes a day 5 to 6 days a week as opposed two as she is currently doing.   I have asked her to reach out to either myself or PCP if her SBP is consistently greater than 130 mmHg at home as this could also be contributing to her dyspnea on exertion.  If her dyspnea continues or worsens despite improving her weight and/or blood pressure will repeat ischemic workup for further evaluation.  EKG today shows sinus rhythm without underlying injury pattern.  Patient is scheduled to have a CT of the chest to follow-up on the pulmonary nodule noted on coronary calcium score.  Will defer workup to PCP.  No additional testing warranted at this time.  FINAL MEDICATION LIST END OF ENCOUNTER: No orders of the defined types were placed in this encounter.   Medications Discontinued During This Encounter  Medication Reason   misoprostol (CYTOTEC) 200 MCG tablet Completed Course     Current Outpatient Medications:    atorvastatin (LIPITOR) 20 MG tablet, TAKE 1 TABLET BY MOUTH ONCE DAILY AT BEDTIME, Disp: 90 tablet, Rfl: 0   Calcium 250 MG CAPS, as directed Orally, Disp: , Rfl:    Cholecalciferol 50 MCG (2000 UT) TABS, 1 tablet Orally Once a day for 30 day(s), Disp: , Rfl:    Coenzyme Q10 10 MG capsule, as directed Orally, Disp: , Rfl:    losartan-hydrochlorothiazide (HYZAAR) 100-25 MG tablet, Take 1 tablet by mouth daily., Disp: , Rfl:    mirabegron ER (MYRBETRIQ) 50 MG TB24 tablet, Take 50 mg by mouth daily., Disp: , Rfl:    aspirin EC 81 MG tablet, Take 1 tablet (81 mg total) by mouth daily. Swallow whole. (Patient not taking: Reported on 08/06/2022), Disp: 30 tablet, Rfl:  11  Orders Placed This Encounter  Procedures   EKG 12-Lead     There are no Patient Instructions on file for this visit.   --Continue cardiac medications as reconciled in final medication list. --Return in about 1 year (around 08/06/2023) for Annual follow up visit, Coronary artery calcification. Or sooner if needed. --Continue follow-up with your primary care physician regarding the management of your other chronic comorbid conditions.  Patient's questions and concerns were addressed to her satisfaction. She voices understanding of the instructions provided during this encounter.   This note was created using a voice recognition software as a result there may be grammatical errors inadvertently enclosed that do not reflect the nature of this encounter. Every attempt is made to correct such errors.  Rex Kras, [place], [facility]  Pager: [phone] Office: [phone]

## 2022-08-08 ENCOUNTER — Other Ambulatory Visit: Payer: Self-pay

## 2022-08-08 DIAGNOSIS — R931 Abnormal findings on diagnostic imaging of heart and coronary circulation: Secondary | ICD-10-CM

## 2022-08-08 DIAGNOSIS — I700 Atherosclerosis of aorta: Secondary | ICD-10-CM

## 2022-08-08 MED ORDER — ATORVASTATIN CALCIUM 20 MG PO TABS: 20.0000 mg | ORAL_TABLET | Freq: Every day | ORAL | 3 refills | Status: DC

## 2022-09-04 ENCOUNTER — Ambulatory Visit
Admission: RE | Admit: 2022-09-04 | Discharge: 2022-09-04 | Disposition: A | Discharge status: Home / Self Care | Payer: Managed Care, Other (non HMO) | Source: Ambulatory Visit | Attending: Internal Medicine | Admitting: Internal Medicine

## 2022-09-04 DIAGNOSIS — Z87891 Personal history of nicotine dependence: Secondary | ICD-10-CM

## 2022-09-06 ENCOUNTER — Other Ambulatory Visit: Payer: Self-pay | Admitting: Registered Nurse

## 2022-09-06 DIAGNOSIS — R911 Solitary pulmonary nodule: Secondary | ICD-10-CM

## 2022-10-15 ENCOUNTER — Ambulatory Visit
Admission: RE | Admit: 2022-10-15 | Discharge: 2022-10-15 | Disposition: A | Discharge status: Home / Self Care | Payer: Managed Care, Other (non HMO) | Source: Ambulatory Visit | Attending: Registered Nurse | Admitting: Registered Nurse

## 2022-10-15 DIAGNOSIS — R911 Solitary pulmonary nodule: Secondary | ICD-10-CM

## 2022-10-23 ENCOUNTER — Other Ambulatory Visit: Payer: Self-pay | Admitting: Cardiology

## 2022-10-23 DIAGNOSIS — I3139 Other pericardial effusion (noninflammatory): Secondary | ICD-10-CM

## 2022-10-23 NOTE — Progress Notes (Signed)
Patient recently had a CT of the chest without contrast which reported a small pericardial effusion.  CT is often overestimate the severity of pericardial effusion if present.  When compared to prior echocardiogram this was not present as per the report.  Will order an echocardiogram to further evaluate this.  Alice Perrow Grannis, DO, [facility]

## 2023-01-09 ENCOUNTER — Other Ambulatory Visit: Payer: Self-pay | Admitting: Cardiology

## 2023-01-09 DIAGNOSIS — R931 Abnormal findings on diagnostic imaging of heart and coronary circulation: Secondary | ICD-10-CM

## 2023-01-09 DIAGNOSIS — I700 Atherosclerosis of aorta: Secondary | ICD-10-CM

## 2023-04-23 ENCOUNTER — Other Ambulatory Visit: Payer: Self-pay | Admitting: Cardiology

## 2023-04-23 DIAGNOSIS — R931 Abnormal findings on diagnostic imaging of heart and coronary circulation: Secondary | ICD-10-CM

## 2023-04-23 DIAGNOSIS — I700 Atherosclerosis of aorta: Secondary | ICD-10-CM

## 2023-07-16 ENCOUNTER — Other Ambulatory Visit: Payer: Self-pay | Admitting: Medical Genetics

## 2023-07-25 ENCOUNTER — Other Ambulatory Visit (HOSPITAL_COMMUNITY)
Admission: RE | Admit: 2023-07-25 | Discharge: 2023-07-25 | Disposition: A | Discharge status: Home / Self Care | Payer: Self-pay | Source: Ambulatory Visit | Attending: Oncology | Admitting: Oncology

## 2023-08-06 DIAGNOSIS — E559 Vitamin D deficiency, unspecified: Secondary | ICD-10-CM | POA: Diagnosis not present

## 2023-08-06 DIAGNOSIS — I10 Essential (primary) hypertension: Secondary | ICD-10-CM | POA: Diagnosis not present

## 2023-08-06 DIAGNOSIS — Z0000 Encounter for general adult medical examination without abnormal findings: Secondary | ICD-10-CM | POA: Diagnosis not present

## 2023-08-06 DIAGNOSIS — E041 Nontoxic single thyroid nodule: Secondary | ICD-10-CM | POA: Diagnosis not present

## 2023-08-07 ENCOUNTER — Ambulatory Visit: Payer: Managed Care, Other (non HMO) | Admitting: Cardiology

## 2023-08-11 ENCOUNTER — Ambulatory Visit: Payer: No Typology Code available for payment source | Attending: Cardiology | Admitting: Cardiology

## 2023-08-11 ENCOUNTER — Encounter: Payer: Self-pay | Admitting: Cardiology

## 2023-08-11 VITALS — BP 116/72 | HR 60 | Resp 16 | Ht 68.0 in | Wt 233.2 lb

## 2023-08-11 DIAGNOSIS — R931 Abnormal findings on diagnostic imaging of heart and coronary circulation: Secondary | ICD-10-CM | POA: Diagnosis not present

## 2023-08-11 DIAGNOSIS — I700 Atherosclerosis of aorta: Secondary | ICD-10-CM

## 2023-08-11 DIAGNOSIS — I2510 Atherosclerotic heart disease of native coronary artery without angina pectoris: Secondary | ICD-10-CM | POA: Diagnosis not present

## 2023-08-11 DIAGNOSIS — Z6835 Body mass index (BMI) 35.0-35.9, adult: Secondary | ICD-10-CM

## 2023-08-11 DIAGNOSIS — I10 Essential (primary) hypertension: Secondary | ICD-10-CM | POA: Diagnosis not present

## 2023-08-11 DIAGNOSIS — E66812 Obesity, class 2: Secondary | ICD-10-CM | POA: Diagnosis not present

## 2023-08-11 MED ORDER — ATORVASTATIN CALCIUM 20 MG PO TABS
20.0000 mg | ORAL_TABLET | Freq: Every day | ORAL | 3 refills | Status: DC
Start: 2023-08-11 — End: 2023-10-15

## 2023-08-11 NOTE — Patient Instructions (Signed)
 Medication Instructions:  Your physician recommends that you continue on your current medications as directed. Please refer to the Current Medication list given to you today.  Refill for Atorvastatin (Lipitor) has been sent to the pharmacy.  *If you need a refill on your cardiac medications before your next appointment, please call your pharmacy*  Lab Work: None ordered today. If you have labs (blood work) drawn today and your tests are completely normal, you will receive your results only by: MyChart Message (if you have MyChart) OR A paper copy in the mail If you have any lab test that is abnormal or we need to change your treatment, we will call you to review the results.  Testing/Procedures: None ordered today.  Follow-Up: At [facility], you and your health needs are our priority.  As part of our continuing mission to provide you with exceptional heart care, we have created designated Provider Care Teams.  These Care Teams include your primary Cardiologist (physician) and Advanced Practice Providers (APPs -  Physician Assistants and Nurse Practitioners) who all work together to provide you with the care you need, when you need it.  Your next appointment:   1 year(s)  The format for your next appointment:   In Person  Provider:   Tessa Lerner, DO {

## 2023-08-11 NOTE — Progress Notes (Signed)
 Cardiology Office Note:  .   Date:  08/11/2023  ID:  Alice Castaneda, DOB February 26, 1956, MRN [id number] PCP:  Merri Brunette, MD  Former Cardiology Providers: None  HeartCare Providers Cardiologist:  Tessa Lerner, DO , [facility] (established care 05/24/2021 ) Electrophysiologist:  None  Click to update primary MD,subspecialty MD or APP then REFRESH:1}    Chief Complaint  Patient presents with   Agatston coronary artery calcium score less than 100   Follow-up    1 year    History of Present Illness: Alice Castaneda is a 68 y.o. African-American female whose past medical history and cardiovascular risk factors includes: Mild coronary artery calcification (total CAC 62.7, 86 percentile), aortic atherosclerosis, hx of fen-phen utilization, former smoker, pulmonary nodule, family history of heart disease.   Patient is being followed by the practice given her history of coronary artery calcification.  In the past her total score was 62.7 consistent with mild CAC placing her at the 86 percentile.  She did undergo ischemic workup as outlined below.  She presents today for 1 year follow-up.   Since last office visit she is doing well from a cardiovascular standpoint.  She denies anginal chest pain or heart failure symptoms.  No hospitalizations or urgent care visits for cardiovascular reasons.  Overall function capacity remains stable.  Review of Systems: .   Review of Systems  Cardiovascular:  Negative for chest pain, claudication, irregular heartbeat, leg swelling, near-syncope, orthopnea, palpitations, paroxysmal nocturnal dyspnea and syncope.  Respiratory:  Negative for shortness of breath.   Hematologic/Lymphatic: Negative for bleeding problem.    Studies Reviewed:   EKG: EKG Interpretation Date/Time:  Monday August 11 2023 11:35:40 EDT Ventricular Rate:  54 PR Interval:  164 QRS Duration:  78 QT Interval:  402 QTC Calculation: 381 R Axis:   -18  Text Interpretation: Sinus  bradycardia No previous ECGs available Confirmed by Tessa Lerner [phone]) on 08/11/2023 11:37:41 AM  Echocardiogram: 06/19/2021: Left ventricle cavity is normal in size and wall thickness. Normal global wall motion. Normal LV systolic function with EF 61%. Normal diastolic filling pattern.  Trace MR, trace TR. No evidence of pulmonary hypertension.   Stress Testing: Exercise Sestamibi stress test 06/18/2021: Exercise time 5 minutes 55 seconds on Bruce protocol, achieved 7.05 METS, 91% of APMHR. Stress ECG negative for ischemia. Normal myocardial perfusion without convincing evidence of reversible myocardial ischemia or prior infarct. Calculated LVEF preserved, 78%. Left ventricular wall thickness preserved without regional wall motion abnormalities. No prior studies available for comparison. Low risk study..  Coronary artery calcium scoring: 03/22/2021: Left Main: 0   LAD: 62.7   LCx: 0   RCA: 0   Total Agatston Score: 62.7   MESA database percentile: 86   AORTA MEASUREMENTS: Ascending Aorta: 29 mm Descending Aorta: 24 mm 1. Coronary calcium score is 62.7 and this is at percentile 86 for patients of the same age, gender and ethnicity. 2. Small indeterminate pulmonary nodules. No follow-up needed if patient is low-risk (and has no known or suspected primary neoplasm). Non-contrast chest CT can be considered in 12 months if patient is high-risk. This recommendation follows the consensus statement: Guidelines for Management of Incidental Pulmonary Nodules Detected on CT Images: From the Fleischner Society 2017; Radiology 2017; 284:228-243. 3.  Aortic Atherosclerosis (ICD10-I70.0).  RADIOLOGY: CT chest w/o contrast:  10/19/2022 1. Mild patchy ground-glass opacity in the anteromedial aspect of the right upper lobe over multiple slices. This does not have a nodular appearance.  This could be due to an infectious or inflammatory process. 2. Stable 2 mm right upper lobe nodule. The  previously described tiny nodule along the major fissure on the right is unchanged, compatible with a small normal lymph node. No further follow-up recommended unless clinically indicated. 3. Small pericardial effusion. 4. Mild calcific coronary artery and aortic atherosclerosis.  Aortic Atherosclerosis (ICD10-I70.0).   Risk Assessment/Calculations:   The 10-year ASCVD risk score (Arnett DK, et al., 2019) is: 4.9%   Values used to calculate the score:     Age: 73 years     Sex: Female     Is Non-Hispanic African American: Yes     Diabetic: No     Tobacco smoker: No     Systolic Blood Pressure: 116 mmHg     Is BP treated: Yes     HDL Cholesterol: 62 mg/dL     Total Cholesterol: 134 mg/dL  Labs:     External Labs: Collected: 08/06/2023 [facility] database. Total cholesterol 128, HDL 64, LDL 50, triglycerides 69  Physical Exam:    Today's Vitals   08/11/23 1132  BP: 116/72  Pulse: 60  Resp: 16  SpO2: 97%  Weight: 233 lb 3.2 oz (105.8 kg)  Height: 5\' 8"  (1.727 m)   Body mass index is 35.46 kg/m. Wt Readings from Last 3 Encounters:  08/11/23 233 lb 3.2 oz (105.8 kg)  08/06/22 234 lb (106.1 kg)  08/07/21 225 lb (102.1 kg)    Physical Exam  Constitutional: No distress.  Age appropriate, hemodynamically stable.   Neck: No JVD present.  Cardiovascular: Normal rate, regular rhythm, S1 normal, S2 normal, intact distal pulses and normal pulses. Exam reveals no gallop, no S3 and no S4.  No murmur heard. Pulmonary/Chest: Effort normal and breath sounds normal. No stridor. She has no wheezes. She has no rales.  Abdominal: Soft. Bowel sounds are normal. She exhibits no distension. There is no abdominal tenderness.  Musculoskeletal:        General: No edema.     Cervical back: Neck supple.  Neurological: She is alert and oriented to person, place, and time. She has intact cranial nerves (2-12).  Skin: Skin is warm and moist.     Impression & Recommendation(s):  Impression:    ICD-10-CM   1. Coronary artery calcification  I25.10 EKG 12-Lead    2. Aortic atherosclerosis (HCC)  I70.0 atorvastatin (LIPITOR) 20 MG tablet    3. Benign hypertension  I10     4. Class 2 severe obesity due to excess calories with serious comorbidity and body mass index (BMI) of 35.0 to 35.9 in adult (HCC)  Z61.096    E66.01    Z68.35     5. Agatston coronary artery calcium score less than 100  R93.1 atorvastatin (LIPITOR) 20 MG tablet       Recommendation(s):  Coronary artery calcification Agatston coronary artery calcium score less than 100 Aortic atherosclerosis (HCC) Total CAC 62.7, 86 percentile Continue aspirin 81 mg p.o. daily. Continue atorvastatin 20 mg p.o. nightly, medication refilled.  LDL currently at goal Reemphasized the importance of secondary prevention with focus on improving her modifiable cardiovascular risk factors such as glycemic control, lipid management, blood pressure control, weight loss.  Benign hypertension Office blood pressures are at goal. Continue losartan/hydrochlorothiazide 100/25 mg p.o. daily Reemphasized importance of low-salt diet.  Hyperlipidemia, mixed: Currently on Lipitor 20 mg p.o. daily.   She denies myalgia or other side effects. Most recent lipids dated  August 06, 2023, independently  reviewed as noted above.  LDL is 50 mg/dL, and goal.  Refill Lipitor.  Class 2 severe obesity due to excess calories with serious comorbidity and body mass index (BMI) of 35.0 to 35.9 in adult [facility]) Body mass index is 35.46 kg/m. I reviewed with her importance of diet, regular physical activity/exercise, weight loss.   Patient is educated on the importance of increasing physical activity gradually as tolerated with a goal of moderate intensity exercise for 30 minutes a day 5 days a week.  Orders Placed:  Orders Placed This Encounter  Procedures   EKG 12-Lead    Final Medication List:    Meds ordered this encounter  Medications    atorvastatin (LIPITOR) 20 MG tablet    Sig: Take 1 tablet (20 mg total) by mouth at bedtime.    Dispense:  90 tablet    Refill:  3    Medications Discontinued During This Encounter  Medication Reason   mirabegron ER (MYRBETRIQ) 50 MG TB24 tablet Ineffective   atorvastatin (LIPITOR) 20 MG tablet Reorder     Current Outpatient Medications:    aspirin EC 81 MG tablet, Take 1 tablet (81 mg total) by mouth daily. Swallow whole., Disp: 30 tablet, Rfl: 11   Calcium 250 MG CAPS, as directed Orally, Disp: , Rfl:    Cholecalciferol 50 MCG (2000 UT) TABS, 1 tablet Orally Once a day for 30 day(s), Disp: , Rfl:    Coenzyme Q10 10 MG capsule, as directed Orally, Disp: , Rfl:    losartan-hydrochlorothiazide (HYZAAR) 100-25 MG tablet, Take 1 tablet by mouth daily., Disp: , Rfl:    atorvastatin (LIPITOR) 20 MG tablet, Take 1 tablet (20 mg total) by mouth at bedtime., Disp: 90 tablet, Rfl: 3  Consent:   NA  Disposition:   1 year follow-up sooner if needed.  Her questions and concerns were addressed to her satisfaction. She voices understanding of the recommendations provided during this encounter.    Signed, Tessa Lerner, DO, [facility]  [facility]  [address] [place], [place] [address] 08/11/2023 1:22 PM

## 2023-08-12 LAB — GENECONNECT MOLECULAR SCREEN: Genetic Analysis Overall Interpretation: NEGATIVE

## 2023-08-13 DIAGNOSIS — I700 Atherosclerosis of aorta: Secondary | ICD-10-CM | POA: Diagnosis not present

## 2023-08-13 DIAGNOSIS — Z0000 Encounter for general adult medical examination without abnormal findings: Secondary | ICD-10-CM | POA: Diagnosis not present

## 2023-08-13 DIAGNOSIS — R7301 Impaired fasting glucose: Secondary | ICD-10-CM | POA: Diagnosis not present

## 2023-08-13 DIAGNOSIS — I10 Essential (primary) hypertension: Secondary | ICD-10-CM | POA: Diagnosis not present

## 2023-08-13 DIAGNOSIS — Z23 Encounter for immunization: Secondary | ICD-10-CM | POA: Diagnosis not present

## 2023-08-13 DIAGNOSIS — I2510 Atherosclerotic heart disease of native coronary artery without angina pectoris: Secondary | ICD-10-CM | POA: Diagnosis not present

## 2023-09-15 DIAGNOSIS — F17211 Nicotine dependence, cigarettes, in remission: Secondary | ICD-10-CM | POA: Diagnosis not present

## 2023-09-15 DIAGNOSIS — I10 Essential (primary) hypertension: Secondary | ICD-10-CM | POA: Diagnosis not present

## 2023-09-15 DIAGNOSIS — Z6834 Body mass index (BMI) 34.0-34.9, adult: Secondary | ICD-10-CM | POA: Diagnosis not present

## 2023-09-15 DIAGNOSIS — E041 Nontoxic single thyroid nodule: Secondary | ICD-10-CM | POA: Diagnosis not present

## 2023-09-15 DIAGNOSIS — E669 Obesity, unspecified: Secondary | ICD-10-CM | POA: Diagnosis not present

## 2023-09-15 DIAGNOSIS — Z008 Encounter for other general examination: Secondary | ICD-10-CM | POA: Diagnosis not present

## 2023-09-15 DIAGNOSIS — E785 Hyperlipidemia, unspecified: Secondary | ICD-10-CM | POA: Diagnosis not present

## 2023-10-15 ENCOUNTER — Telehealth: Payer: Self-pay | Admitting: Cardiology

## 2023-10-15 DIAGNOSIS — R931 Abnormal findings on diagnostic imaging of heart and coronary circulation: Secondary | ICD-10-CM

## 2023-10-15 DIAGNOSIS — I700 Atherosclerosis of aorta: Secondary | ICD-10-CM

## 2023-10-15 MED ORDER — ATORVASTATIN CALCIUM 20 MG PO TABS
20.0000 mg | ORAL_TABLET | Freq: Every day | ORAL | 3 refills | Status: AC
Start: 2023-10-15 — End: ?

## 2023-10-15 NOTE — Telephone Encounter (Signed)
*  STAT* If patient is at the pharmacy, call can be transferred to refill team.   1. Which medications need to be refilled? (please list name of each medication and dose if known) atorvastatin  (LIPITOR) 20 MG tablet [[id number]]    2. Would you like to learn more about the convenience, safety, & potential cost savings by using the [facility]? No    3. Are you open to using the [facility] (Type [facility]. na.   4. Which pharmacy/location (including street and city if local pharmacy) is medication to be sent to?[facility] mail order . [phone]   5. Do they need a 30 day or 90 day supply? 90

## 2023-10-15 NOTE — Telephone Encounter (Signed)
 RX sent to requested Pharmacy

## 2024-01-29 DIAGNOSIS — Z01419 Encounter for gynecological examination (general) (routine) without abnormal findings: Secondary | ICD-10-CM | POA: Diagnosis not present

## 2024-01-29 DIAGNOSIS — Z6835 Body mass index (BMI) 35.0-35.9, adult: Secondary | ICD-10-CM | POA: Diagnosis not present

## 2024-02-23 DIAGNOSIS — Z1231 Encounter for screening mammogram for malignant neoplasm of breast: Secondary | ICD-10-CM | POA: Diagnosis not present
